# Patient Record
Sex: Female | Born: 1975
Health system: Southern US, Community
[De-identification: ages and names within clinical notes are randomized; demographics above are authoritative.]

## PROBLEM LIST (undated history)

## (undated) DIAGNOSIS — D649 Anemia, unspecified: Secondary | ICD-10-CM

## (undated) DIAGNOSIS — I10 Essential (primary) hypertension: Secondary | ICD-10-CM

## (undated) HISTORY — PX: TONSILLECTOMY: SHX5217

## (undated) HISTORY — DX: Essential (primary) hypertension: I10

## (undated) HISTORY — PX: LEG SURGERY: SHX1003

## (undated) HISTORY — DX: Anemia, unspecified: D64.9

---

## 1998-01-03 ENCOUNTER — Ambulatory Visit (HOSPITAL_COMMUNITY): Admission: RE | Admit: 1998-01-03 | Discharge: 1998-01-03 | Payer: Self-pay | Admitting: Obstetrics and Gynecology

## 1998-01-29 ENCOUNTER — Ambulatory Visit (HOSPITAL_COMMUNITY): Admission: RE | Admit: 1998-01-29 | Discharge: 1998-01-29 | Payer: Self-pay | Admitting: Obstetrics and Gynecology

## 1998-02-08 ENCOUNTER — Ambulatory Visit (HOSPITAL_COMMUNITY): Admission: RE | Admit: 1998-02-08 | Discharge: 1998-02-08 | Payer: Self-pay | Admitting: Obstetrics and Gynecology

## 1998-04-27 ENCOUNTER — Inpatient Hospital Stay (HOSPITAL_COMMUNITY): Admission: AD | Admit: 1998-04-27 | Discharge: 1998-04-29 | Payer: Self-pay | Admitting: Obstetrics and Gynecology

## 2001-04-25 ENCOUNTER — Encounter (INDEPENDENT_AMBULATORY_CARE_PROVIDER_SITE_OTHER): Payer: Self-pay | Admitting: Specialist

## 2001-04-26 ENCOUNTER — Encounter: Payer: Self-pay | Admitting: Emergency Medicine

## 2001-04-26 ENCOUNTER — Inpatient Hospital Stay (HOSPITAL_COMMUNITY): Admission: EM | Admit: 2001-04-26 | Discharge: 2001-04-28 | Payer: Self-pay | Admitting: Family Medicine

## 2001-08-17 HISTORY — PX: APPENDECTOMY: SHX54

## 2004-08-17 HISTORY — PX: TOE SURGERY: SHX1073

## 2005-04-24 ENCOUNTER — Other Ambulatory Visit: Admission: RE | Admit: 2005-04-24 | Discharge: 2005-04-24 | Payer: Self-pay | Admitting: Internal Medicine

## 2006-11-08 ENCOUNTER — Other Ambulatory Visit: Admission: RE | Admit: 2006-11-08 | Discharge: 2006-11-08 | Payer: Self-pay | Admitting: *Deleted

## 2007-11-15 ENCOUNTER — Other Ambulatory Visit: Admission: RE | Admit: 2007-11-15 | Discharge: 2007-11-15 | Payer: Self-pay | Admitting: Family Medicine

## 2011-01-02 NOTE — Op Note (Signed)
[facility]  Patient:    [name], [name] Visit Number: [id number] MRN: [id number]          Service Type: SUR Location: 1E 0106 01 Attending Physician:  Donnetta Hutching Proc. Date: 04/26/01 Admit Date:  04/25/2001                             Operative Report  PREOPERATIVE DIAGNOSIS:   Acute appendicitis.  POSTOPERATIVE DIAGNOSIS:  Acute appendicitis.  OPERATION:  Open appendectomy.  SURGEON:  Abigail Miyamoto, M.D.  ANESTHESIA:  General endotracheal anesthesia and 0.5% Marcaine plain.  ESTIMATED BLOOD LOSS:  Minimal.  INDICATIONS:  The patient is a 35 year old female who presented to the emergency department with vague abdominal pain and nausea which has been localized to the right upper quadrant.  She was found to have a normal white blood count, however, was found to have a left shift. A CAT scan of the abdomen was performed which showed findings consistent with possible early acute appendicitis.  FINDINGS:  The patient was found to have acute appendicitis.  DESCRIPTION OF PROCEDURE:  The patient was brought to the operating room and identified as Julia Morris.  She was placed supine on the operating table, and general anesthesia was induced. Her abdomen was then prepped and draped in the usual sterile fashion.  Using a #10 blade a small transverse incision was made in the patients right lower quadrant.  Incision was carried down through Scarpas fascia with electrocautery.  The external oblique fascia was identified and opened with the cautery.  The underlying muscles were then split.  The underlying muscles were then split down to the perineum which was then opened with a scalpel.  The cecum was then identified and elevated up out of the wound.  The patient did have some mildly turbid fluid in the abdominal cavity.  The appendix was identified and found to be quite acutely inflamed. It was tethered down underneath the cecum, therefore the  base of the appendix was first clamped with hemostats and then cut with the scalpel. The base was then tied off with two separate 2-0 Vicryl ties.  The appendiceal stump was then cauterized.  The proximal was likewise tied off.  The mesentery was then taken down with clamps and Vicryl tied as well.  Next, the fascia had to be opened further to allow the cecum and small bowel to be placed back into the abdominal cavity.  The abdomen was then copiously irrigated with normal saline. A separate small vein bleeding on the side of the cecum was tied off with two separate 2-0 silk sutures.  The abdomen was then copiously irrigated with normal saline.  The posterior layer was then closed with a running 2-0 Vicryl suture.  The anterior fascia was then closed with a running 2-0 Vicryl suture as well.  The wound was again then irrigated and anesthestized with 0.25% Marcaine.  The Scarpas fascia was then closed with interrupted 3-0 Vicryl suture.  The skin was closed with running 4-0 Vicryl suture. Steri-Strips were then applied.  The patient tolerated the procedure well. All sponge, needle and instrument counts were correct at the end of the procedure.  The patient was then extubated in the operating room and taken in stable condition to the recovery room. Attending Physician:  Donnetta Hutching DD:  04/26/01 TD:  04/26/01 Job: [id number]

## 2011-03-09 ENCOUNTER — Encounter: Payer: Self-pay | Admitting: Family Medicine

## 2011-03-09 ENCOUNTER — Ambulatory Visit (INDEPENDENT_AMBULATORY_CARE_PROVIDER_SITE_OTHER): Payer: Self-pay | Admitting: Family Medicine

## 2011-03-09 VITALS — BP 159/88 | HR 69 | Temp 98.4°F | Ht 64.0 in | Wt 185.0 lb

## 2011-03-09 DIAGNOSIS — M25572 Pain in left ankle and joints of left foot: Secondary | ICD-10-CM

## 2011-03-09 DIAGNOSIS — M25579 Pain in unspecified ankle and joints of unspecified foot: Secondary | ICD-10-CM

## 2011-03-09 NOTE — Patient Instructions (Signed)
This area of your pain is not a typical location for a stress fracture. It is, however, a common location for extensor tendinitis of your foot. Regardless, I would rest from running for the next 1 week. Ok to cross train (biking, swimming preferable) in the meantime. After this time start a walk:jog program 1 minute: 1 minute for 10 minutes, increase jog time by 1 minute each session and total running time by 5 minutes per session (as long as your pain does not come back). Consider inserts with arch support and cushion for your shoes - these blunt the compressive and rotational forces that lead to stress fractures. Ice for 15 minutes after your workouts. Aleve 1-2 tabs twice a day with food is ok to take if you have mild pain. When you get back into running, ok to do so as long as you're not limping and pain stays less than a 3 on a scale of 1-10.  If pain starts to worsen again, come see me. Follow up with me as needed.

## 2011-03-10 ENCOUNTER — Encounter: Payer: Self-pay | Admitting: Family Medicine

## 2011-03-10 DIAGNOSIS — M25572 Pain in left ankle and joints of left foot: Secondary | ICD-10-CM | POA: Insufficient documentation

## 2011-03-10 NOTE — Progress Notes (Signed)
Subjective:    Patient ID: Julia Morris, female    DOB: 04-Sep-1975, 35 y.o.   MRN: [id number]  PCP: [facility]  HPI 35 yo F here for left proximal foot/shin pain.  Patient reports she is currently training to do a marathon. Runs about 6-7x/week 1 1/2 to 3 miles at a time (6 miles long run) - total of about 20 miles a week. About 2 weeks ago she developed pain in proximal left foot during her run that worsened with running. Iced the area but has not taken any medicines. Tried running again and noticed pain in this area but also radiating into distal shin, lateral ankle. No swelling or bruising. No injury. No prior history of stress fracture. No pain currently - has not run for past week. Pain tends to come on about 1 mile into her runs (back before she stopped running).  History reviewed. No pertinent past medical history.  No current outpatient prescriptions on file prior to visit.    Past Surgical History  Procedure Date  . Leg surgery     femoral rod placement s/p fracture 2005 left leg    No Known Allergies  History   Social History  . Marital Status: Single    Spouse Name: N/A    Number of Children: N/A  . Years of Education: N/A   Occupational History  . Not on file.   Social History Main Topics  . Smoking status: Never Smoker   . Smokeless tobacco: Not on file  . Alcohol Use: Not on file  . Drug Use: Not on file  . Sexually Active: Not on file   Other Topics Concern  . Not on file   Social History Narrative  . No narrative on file    Family History  Problem Relation Age of Onset  . Hypertension Mother   . Hyperlipidemia Mother   . Hypertension Father   . Hyperlipidemia Father   . Diabetes Maternal Aunt   . Hypertension Maternal Aunt   . Hyperlipidemia Maternal Aunt   . Diabetes Maternal Uncle   . Hypertension Maternal Uncle   . Hyperlipidemia Maternal Uncle   . Diabetes Paternal Aunt   . Hypertension Paternal Aunt   .  Hyperlipidemia Paternal Aunt   . Diabetes Paternal Uncle   . Hypertension Paternal Uncle   . Hyperlipidemia Paternal Uncle   . Diabetes Maternal Grandmother   . Hypertension Maternal Grandmother   . Hyperlipidemia Maternal Grandmother   . Diabetes Maternal Grandfather   . Hypertension Maternal Grandfather   . Hyperlipidemia Maternal Grandfather   . Diabetes Paternal Grandmother   . Hypertension Paternal Grandmother   . Hyperlipidemia Paternal Grandmother   . Heart attack Neg Hx   . Sudden death Neg Hx     BP 159/88  Pulse 69  Temp(Src) 98.4 F (36.9 C) (Oral)  Ht 5\' 4"  (1.626 m)  Wt 185 lb (83.915 kg)  BMI 31.76 kg/m2  Review of Systems See HPI above.    Objective:   Physical Exam Gen: NAD L foot/ankle: No gross deformity, swelling, bruising. No focal TTP.  Area of her pain from before is within anterior ankle extensor tendons proximally into lower leg lateral to tibia.  No TTP tibia, fibular head, malleoli, base 5th, all metatarsals, navicular. FROM ankle without pain.  5/5 strength all directions without pain. Negative ant drawer and talar tilt. Negative thompsons Negative hop test and tuning for testing.      Assessment & Plan:  1.  Left foot/ankle pain - patients exam currently normal without pain.  Location of her pain and radiation up shin lateral to tibia suggests most likely extensor tendinopathy (extensor digitorum and tibialis anterior).  Stress fracture tests also normal and this is not a typical area to get a stress fracture.  Rest for 1 week, cross train in meantime then start walk:jog program.  Icing, tylenol/aleve as needed.  Consider arch support/inserts.  See instructions for further.

## 2011-03-10 NOTE — Assessment & Plan Note (Signed)
patients exam currently normal without pain.  Location of her pain and radiation up shin lateral to tibia suggests most likely extensor tendinopathy (extensor digitorum and tibialis anterior).  Stress fracture tests also normal and this is not a typical area to get a stress fracture.  Rest for 1 week, cross train in meantime then start walk:jog program.  Icing, tylenol/aleve as needed.  Consider arch support/inserts.  See instructions for further.

## 2012-09-27 ENCOUNTER — Ambulatory Visit (INDEPENDENT_AMBULATORY_CARE_PROVIDER_SITE_OTHER): Payer: BC Managed Care – PPO | Admitting: Family

## 2012-09-27 ENCOUNTER — Encounter: Payer: Self-pay | Admitting: Family

## 2012-09-27 VITALS — BP 150/104 | HR 91 | Temp 98.4°F | Resp 16 | Ht 64.0 in | Wt 208.1 lb

## 2012-09-27 DIAGNOSIS — R51 Headache: Secondary | ICD-10-CM

## 2012-09-27 DIAGNOSIS — I10 Essential (primary) hypertension: Secondary | ICD-10-CM

## 2012-09-27 DIAGNOSIS — H6120 Impacted cerumen, unspecified ear: Secondary | ICD-10-CM

## 2012-09-27 DIAGNOSIS — H6690 Otitis media, unspecified, unspecified ear: Secondary | ICD-10-CM

## 2012-09-27 DIAGNOSIS — R002 Palpitations: Secondary | ICD-10-CM

## 2012-09-27 LAB — BASIC METABOLIC PANEL WITH GFR
BUN: 17 mg/dL (ref 6–23)
CO2: 29 mEq/L (ref 19–32)
Calcium: 9.4 mg/dL (ref 8.4–10.5)
Chloride: 103 mEq/L (ref 96–112)
Creat: 0.87 mg/dL (ref 0.50–1.10)
GFR, Est African American: >89 mL/min
GFR, Est Non African American: 87 mL/min
Glucose, Bld: 80 mg/dL (ref 70–99)
Potassium: 4.1 mEq/L (ref 3.5–5.3)
Sodium: 139 mEq/L (ref 135–145)

## 2012-09-27 LAB — CBC WITH DIFFERENTIAL/PLATELET
Basophils Relative: 1 % (ref 0–1)
Eosinophils Absolute: 0 10*3/uL (ref 0.0–0.7)
Eosinophils Relative: 0 % (ref 0–5)
MCH: 27.8 pg (ref 26.0–34.0)
MCHC: 33.3 g/dL (ref 30.0–36.0)
MCV: 83.7 fL (ref 78.0–100.0)
Neutrophils Relative %: 44 % (ref 43–77)
Platelets: 291 10*3/uL (ref 150–400)
RDW: 14.6 % (ref 11.5–15.5)

## 2012-09-27 MED ORDER — AMOXICILLIN-POT CLAVULANATE 875-125 MG PO TABS: 1.0000 | ORAL_TABLET | Freq: Two times a day (BID) | ORAL | Status: DC

## 2012-09-27 MED ORDER — HYDROCHLOROTHIAZIDE 25 MG PO TABS: 25.0000 mg | ORAL_TABLET | Freq: Every day | ORAL | Status: DC

## 2012-09-27 NOTE — Patient Instructions (Addendum)
Please follow up in 2 weeks.   Complete your lab work prior to leaving.

## 2012-09-27 NOTE — Progress Notes (Signed)
Subjective:    Patient ID: Julia Morris, female    DOB: 06-Dec-1975, 37 y.o.   MRN: [id number]  HPI  Julia Morris is a 37 yr old female here today to establish care.  1) HA-  Uses frequent "goody powder" She reports headaches 3-4 times a week for 1 year.  She sometimes will use Advil PM's for migraines.  Headache is generally across the front/sides of her head.  3) HTN- she reports that she has been on medication in the past.  She has not taken in 5-6 years.  She sometimes checks at home- it was 158/105.    4) Palpitations- reports that she has palpitations often, sometimes for a few seconds, generally lasts only a minute.  Drinks 2-3 caffeinated soft drinks a day.    She reports that last week she went to urgent care due to dizziness last week and was rx'd hydrocodone, lorazepam, meclizine amd prednisone.  She reports ear fullness for 3-4 months.  She notes trouble hearing out of the right ear.  She denies pain.    Review of Systems  Constitutional:       Reports 20 pound weight gain in 6 weeks.  HENT: Positive for hearing loss.   Eyes: Negative for visual disturbance.  Respiratory: Negative for shortness of breath.   Cardiovascular: Positive for palpitations.  Genitourinary: Negative for menstrual problem.  Musculoskeletal: Negative for myalgias and arthralgias.  Skin: Negative for rash.  Neurological: Negative for headaches.  Hematological: Negative for adenopathy.  Psychiatric/Behavioral:       Denies anxiety or depression   Past Medical History  Diagnosis Date  . Hypertension   . Anemia     ? history of anemia    History   Social History  . Marital Status: Single    Spouse Name: N/A    Number of Children: 1  . Years of Education: N/A   Occupational History  .  [employer] [place]   Social History Main Topics  . Smoking status: Never Smoker   . Smokeless tobacco: Never Used  . Alcohol Use: Yes     Comment: occasional  . Drug Use: Not on file  . Sexually  Active: Not on file   Other Topics Concern  . 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   Enjoys running/exercise.    Past Surgical History  Procedure Laterality Date  . Leg surgery      femoral rod placement s/p fracture 2005 left leg  . Tonsillectomy  1980s  . Appendectomy  2003  . Toe surgery  2006    right great toe.     Family History  Problem Relation Age of Onset  . Hypertension Mother   . Hyperlipidemia Mother   . Hypertension Father   . Hyperlipidemia Father   . Diabetes Maternal Aunt   . Hypertension Maternal Aunt   . Hyperlipidemia Maternal Aunt   . Diabetes Maternal Uncle   . Hypertension Maternal Uncle   . Hyperlipidemia Maternal Uncle   . Diabetes Paternal Aunt   . Hypertension Paternal Aunt   . Hyperlipidemia Paternal Aunt   . Diabetes Paternal Uncle   . Hypertension Paternal Uncle   . Hyperlipidemia Paternal Uncle   . Diabetes Maternal  Grandmother   . Hypertension Maternal Grandmother   . Hyperlipidemia Maternal Grandmother   . Diabetes Maternal Grandfather   . Hypertension Maternal Grandfather   . Hyperlipidemia Maternal Grandfather   . Diabetes Paternal Grandmother   . Hypertension Paternal Grandmother   . Hyperlipidemia Paternal Grandmother   . Heart attack Neg Hx   . Sudden death Neg Hx     No Known Allergies  No current outpatient prescriptions on file prior to visit.   No current facility-administered medications on file prior to visit.    BP 150/104  Pulse 91  Temp(Src) 98.4 F (36.9 C) (Oral)  Resp 16  Ht 5\' 4"  (1.626 m)  Wt 208 lb 0.8 oz (94.371 kg)  BMI 35.69 kg/m2  SpO2 96%  LMP 09/18/2011         Objective:   Physical Exam  Constitutional: She is oriented to person, place, and time. She appears well-developed and  well-nourished. No distress.  HENT:  Head: Normocephalic and atraumatic.  Bilateral tms occluded by cerumen. Wax plugs removed. r tm +erythema l tm normal.  Eyes: Conjunctivae are normal.  Cardiovascular: Normal rate and regular rhythm.   No murmur heard. Pulmonary/Chest: Effort normal and breath sounds normal. No respiratory distress. She has no wheezes.  Musculoskeletal: She exhibits no edema.  Lymphadenopathy:    She has no cervical adenopathy.  Neurological: She is alert and oriented to person, place, and time.  Psychiatric: She has a normal mood and affect. Her behavior is normal.          Assessment & Plan:

## 2012-09-28 ENCOUNTER — Encounter: Payer: Self-pay | Admitting: Family

## 2012-09-28 LAB — TSH: TSH: 2.651 u[IU]/mL (ref 0.350–4.500)

## 2012-09-29 DIAGNOSIS — H6120 Impacted cerumen, unspecified ear: Secondary | ICD-10-CM | POA: Insufficient documentation

## 2012-09-29 DIAGNOSIS — R002 Palpitations: Secondary | ICD-10-CM | POA: Insufficient documentation

## 2012-09-29 DIAGNOSIS — H6690 Otitis media, unspecified, unspecified ear: Secondary | ICD-10-CM | POA: Insufficient documentation

## 2012-09-29 DIAGNOSIS — R519 Headache, unspecified: Secondary | ICD-10-CM | POA: Insufficient documentation

## 2012-09-29 DIAGNOSIS — R51 Headache: Secondary | ICD-10-CM | POA: Insufficient documentation

## 2012-09-29 NOTE — Assessment & Plan Note (Signed)
Could be sinus component. Will see if augmentin helps. Stop nsaids, aspirin products. Instead, recommended prn tylenol.

## 2012-09-29 NOTE — Assessment & Plan Note (Signed)
ekg shows nsr. Obtain bmet, cbc, tsh. If normal and symptoms persist, plan holter monitor.

## 2012-09-29 NOTE — Assessment & Plan Note (Signed)
rx with augmentin.stop prednisone, benzo, vicodin. Likely cause for vertigo.

## 2012-09-29 NOTE — Assessment & Plan Note (Signed)
Cerumen removed with curette. Pt tolerated well.   

## 2012-10-11 ENCOUNTER — Ambulatory Visit: Payer: BC Managed Care – PPO | Admitting: Family

## 2012-10-18 ENCOUNTER — Ambulatory Visit (INDEPENDENT_AMBULATORY_CARE_PROVIDER_SITE_OTHER): Payer: BC Managed Care – PPO | Admitting: Family

## 2012-10-18 ENCOUNTER — Encounter: Payer: Self-pay | Admitting: Family

## 2012-10-18 VITALS — BP 128/88 | HR 88 | Temp 99.0°F | Resp 16 | Ht 64.0 in | Wt 209.1 lb

## 2012-10-18 DIAGNOSIS — R51 Headache: Secondary | ICD-10-CM

## 2012-10-18 DIAGNOSIS — R002 Palpitations: Secondary | ICD-10-CM

## 2012-10-18 DIAGNOSIS — I10 Essential (primary) hypertension: Secondary | ICD-10-CM

## 2012-10-18 LAB — BASIC METABOLIC PANEL
CO2: 28 mEq/L (ref 19–32)
Calcium: 8.8 mg/dL (ref 8.4–10.5)
Creat: 0.83 mg/dL (ref 0.50–1.10)
Glucose, Bld: 79 mg/dL (ref 70–99)
Sodium: 139 mEq/L (ref 135–145)

## 2012-10-18 NOTE — Progress Notes (Signed)
Subjective:    Patient ID: Julia Morris, female    DOB: May 30, 1976, 37 y.o.   MRN: [id number]  HPI  Ms.  Morris is a 37 yr old female who presents today for follow up.  1) HTN- she is on hctz. She reports that her BP at home has been running about 128/70 at home.   Denies associated side effects.    2) Dizziness- last visit she was treated with augmentin and prn meclizine.  3) Palpitations- EKG, BMET and TSH were unremarkable last visit.     4) HA- Reports that she stopped the goodie powder.  HA's are improved.     Review of Systems See HPI  Past Medical History  Diagnosis Date  . Hypertension   . Anemia     ? history of anemia    History   Social History  . Marital Status: Single    Spouse Name: N/A    Number of Children: 1  . Years of Education: N/A   Occupational History  .  [employer] [place]   Social History Main Topics  . Smoking status: Never Smoker   . Smokeless tobacco: Never Used  . Alcohol Use: Yes     Comment: occasional  . Drug Use: Not on file  . Sexually Active: Not on file   Other Topics Concern  . 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   Enjoys running/exercise.    Past Surgical History  Procedure Laterality Date  . Leg surgery      femoral rod placement s/p fracture 2005 left leg  . Tonsillectomy  1980s  . Appendectomy  2003  . Toe surgery  2006    right great toe.     Family History  Problem Relation Age of Onset  . Hypertension Mother   . Hyperlipidemia Mother   . Hypertension Father   . Hyperlipidemia Father   . Diabetes Maternal Aunt   . Hypertension Maternal Aunt   . Hyperlipidemia Maternal Aunt   . Diabetes Maternal Uncle   . Hypertension Maternal Uncle   . Hyperlipidemia Maternal Uncle   . Diabetes Paternal Aunt    . Hypertension Paternal Aunt   . Hyperlipidemia Paternal Aunt   . Diabetes Paternal Uncle   . Hypertension Paternal Uncle   . Hyperlipidemia Paternal Uncle   . Diabetes Maternal Grandmother   . Hypertension Maternal Grandmother   . Hyperlipidemia Maternal Grandmother   . Diabetes Maternal Grandfather   . Hypertension Maternal Grandfather   . Hyperlipidemia Maternal Grandfather   . Diabetes Paternal Grandmother   . Hypertension Paternal Grandmother   . Hyperlipidemia Paternal Grandmother   . Heart attack Neg Hx   . Sudden death Neg Hx     No Known Allergies  Current Outpatient Prescriptions on File Prior to Visit  Medication Sig Dispense Refill  . hydrochlorothiazide (HYDRODIURIL) 25 MG tablet Take 1 tablet (25 mg total) by mouth daily.  30 tablet  2  . meclizine (ANTIVERT) 25 MG tablet Take 25 mg by mouth 3 (three) times daily as needed.       No current facility-administered medications on file prior to visit.    BP 140/90  Pulse 88  Temp(Src) 99 F (37.2 C) (Oral)  Resp 16  Ht 5\' 4"  (1.626 m)  Wt 209 lb 1.3 oz (94.838 kg)  BMI 35.87 kg/m2  SpO2 98%  LMP 09/18/2011       Objective:   Physical Exam  Constitutional: She is oriented to person, place, and time. She appears well-developed and well-nourished. No distress.  HENT:  Head: Normocephalic and atraumatic.  Cardiovascular: Normal rate and regular rhythm.   No murmur heard. Pulmonary/Chest: Effort normal and breath sounds normal. No respiratory distress. She has no wheezes. She has no rales. She exhibits no tenderness.  Musculoskeletal: She exhibits no edema.  Neurological: She is alert and oriented to person, place, and time.  Skin: Skin is warm and dry.  Psychiatric: She has a normal mood and affect. Her behavior is normal. Judgment and thought content normal.          Assessment & Plan:

## 2012-10-18 NOTE — Patient Instructions (Addendum)
Please complete your lab work prior to leaving.  Follow up in 3 months for a fasting complete physical.

## 2012-10-18 NOTE — Assessment & Plan Note (Signed)
Resolved. Monitor.  

## 2012-10-18 NOTE — Assessment & Plan Note (Signed)
Improved. Monitor.  

## 2012-10-18 NOTE — Assessment & Plan Note (Signed)
BP Readings from Last 3 Encounters:  10/18/12 128/88  09/27/12 150/104  03/09/11 159/88    BP is improved. Continue hctz, obtain bmet.

## 2012-12-30 ENCOUNTER — Encounter: Payer: Self-pay | Admitting: Family

## 2013-01-02 ENCOUNTER — Encounter: Payer: Self-pay | Admitting: Family

## 2013-01-16 ENCOUNTER — Ambulatory Visit (INDEPENDENT_AMBULATORY_CARE_PROVIDER_SITE_OTHER): Payer: BC Managed Care – PPO | Admitting: Family

## 2013-01-16 ENCOUNTER — Encounter: Payer: Self-pay | Admitting: Family

## 2013-01-16 VITALS — BP 110/88 | HR 80 | Temp 98.6°F | Resp 16 | Ht 64.0 in | Wt 205.1 lb

## 2013-01-16 DIAGNOSIS — Z0000 Encounter for general adult medical examination without abnormal findings: Secondary | ICD-10-CM

## 2013-01-16 DIAGNOSIS — I10 Essential (primary) hypertension: Secondary | ICD-10-CM

## 2013-01-16 LAB — BASIC METABOLIC PANEL WITH GFR
CO2: 28 mEq/L (ref 19–32)
Calcium: 9.8 mg/dL (ref 8.4–10.5)
Chloride: 101 mEq/L (ref 96–112)
Creat: 0.79 mg/dL (ref 0.50–1.10)
GFR, Est Non African American: >89 mL/min
Sodium: 138 mEq/L (ref 135–145)

## 2013-01-16 LAB — LIPID PANEL
HDL: 48 mg/dL (ref >39)
LDL Cholesterol: 155 mg/dL — ABNORMAL HIGH (ref 0–99)
Total CHOL/HDL Ratio: 4.6 Ratio
Triglycerides: 98 mg/dL (ref <150)
VLDL: 20 mg/dL (ref 0–40)

## 2013-01-16 LAB — CBC WITH DIFFERENTIAL/PLATELET
Eosinophils Relative: 1 % (ref 0–5)
HCT: 39.1 % (ref 36.0–46.0)
Hemoglobin: 13.3 g/dL (ref 12.0–15.0)
Lymphocytes Relative: 41 % (ref 12–46)
Lymphs Abs: 1.9 10*3/uL (ref 0.7–4.0)
MCV: 82.1 fL (ref 78.0–100.0)
Monocytes Absolute: 0.5 10*3/uL (ref 0.1–1.0)
Monocytes Relative: 10 % (ref 3–12)
Neutro Abs: 2.3 10*3/uL (ref 1.7–7.7)
WBC: 4.8 10*3/uL (ref 4.0–10.5)

## 2013-01-16 LAB — HEPATIC FUNCTION PANEL
AST: 22 U/L (ref 0–37)
Albumin: 4.1 g/dL (ref 3.5–5.2)
Bilirubin, Direct: 0.1 mg/dL (ref 0.0–0.3)
Total Bilirubin: 0.4 mg/dL (ref 0.3–1.2)

## 2013-01-16 LAB — TSH: TSH: 2.443 u[IU]/mL (ref 0.350–4.500)

## 2013-01-16 MED ORDER — HYDROCHLOROTHIAZIDE 25 MG PO TABS: 25.0000 mg | ORAL_TABLET | Freq: Every day | ORAL | Status: DC

## 2013-01-16 NOTE — Progress Notes (Signed)
Subjective:    Patient ID: Julia Morris, female    DOB: 1976/03/08, 37 y.o.   MRN: [id number]  HPI  Patient presents today for complete physical.  Immunizations: tetanus is up to date.  Diet: Trying to improve her diet. She has gained 50 pounds in the last 18 months.  Exercise: runs 3 days a week, 6-8 miles. Pap Smear: 3/13- went 3 years without period- has mirena, wants std testing  Review of Systems  Constitutional: Positive for unexpected weight change.  HENT: Negative for hearing loss and congestion.   Eyes: Negative for visual disturbance.  Respiratory: Negative for cough and shortness of breath.   Cardiovascular:       Occasional swelling after running  Gastrointestinal: Negative for nausea, vomiting and diarrhea.  Genitourinary: Negative for dysuria, frequency and menstrual problem.  Musculoskeletal: Negative for myalgias and arthralgias.       Notes some leg cramping  Skin: Negative for rash.  Neurological: Negative for headaches.  Hematological: Negative for adenopathy.  Psychiatric/Behavioral:       Denies depression or anxiety   Past Medical History  Diagnosis Date  . Hypertension   . Anemia     ? history of anemia    History   Social History  . Marital Status: Single    Spouse Name: N/A    Number of Children: 1  . Years of Education: N/A   Occupational History  .  [employer] [place]   Social History Main Topics  . Smoking status: Never Smoker   . Smokeless tobacco: Never Used  . Alcohol Use: Yes     Comment: occasional  . Drug Use: Not on file  . Sexually Active: Not on file   Other Topics Concern  . 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   Enjoys running/exercise.    Past Surgical History  Procedure Laterality Date  . Leg surgery     femoral rod placement s/p fracture 2005 left leg  . Tonsillectomy  1980s  . Appendectomy  2003  . Toe surgery  2006    right great toe.     Family History  Problem Relation Age of Onset  . Hypertension Mother   . Hyperlipidemia Mother   . Hypertension Father   . Hyperlipidemia Father   . Diabetes Maternal Aunt   . Hypertension Maternal Aunt   . Hyperlipidemia Maternal Aunt   . Diabetes Maternal Uncle   . Hypertension Maternal Uncle   . Hyperlipidemia Maternal Uncle   . Diabetes Paternal Aunt   . Hypertension Paternal Aunt   . Hyperlipidemia Paternal Aunt   . Diabetes Paternal Uncle   . Hypertension Paternal Uncle   . Hyperlipidemia Paternal Uncle   . Diabetes Maternal Grandmother   . Hypertension Maternal Grandmother   . Hyperlipidemia Maternal Grandmother   . Diabetes Maternal Grandfather   . Hypertension Maternal Grandfather   . Hyperlipidemia Maternal Grandfather   . Diabetes Paternal Grandmother   . Hypertension Paternal Grandmother   . Hyperlipidemia Paternal Grandmother   . Heart attack Neg Hx   . Sudden death Neg Hx     Allergies  Allergen Reactions  . Prednisone Swelling    No current outpatient prescriptions on file prior to visit.  No current facility-administered medications on file prior to visit.    BP 110/88  Pulse 80  Temp(Src) 98.6 F (37 C) (Oral)  Resp 16  Ht 5\' 4"  (1.626 m)  Wt 205 lb 1.9 oz (93.042 kg)  BMI 35.19 kg/m2  SpO2 98%  LMP 01/16/2013       Objective:   Physical Exam  Physical Exam  Constitutional: She is oriented to person, place, and time. She appears well-developed and well-nourished. No distress.  HENT:  Head: Normocephalic and atraumatic.  Right Ear: Tympanic membrane and ear canal normal.  Left Ear: Tympanic membrane and ear canal normal.  Mouth/Throat: Oropharynx is clear and moist.  Eyes: Pupils are equal, round, and reactive to light. No scleral icterus.  Neck: Normal range of motion. No thyromegaly  present.  Cardiovascular: Normal rate and regular rhythm.   No murmur heard. Pulmonary/Chest: Effort normal and breath sounds normal. No respiratory distress. He has no wheezes. She has no rales. She exhibits no tenderness.  Abdominal: Soft. Bowel sounds are normal. He exhibits no distension and no mass. There is no tenderness. There is no rebound and no guarding.  Musculoskeletal: She exhibits no edema.  Lymphadenopathy:    She has no cervical adenopathy.  Neurological: She is alert and oriented to person, place, and time.  She exhibits normal muscle tone. Coordination normal.  Skin: Skin is warm and dry.  Psychiatric: She has a normal mood and affect. Her behavior is normal. Judgment and thought content normal.  Breasts: Examined lying Right: Without masses, retractions, discharge or axillary adenopathy.  Left: Without masses, retractions, discharge or axillary adenopathy.  GYN- external vulva normal          Assessment & Plan:         Assessment & Plan:

## 2013-01-16 NOTE — Patient Instructions (Addendum)
Please complete lab work prior to leaving. Please schedule a follow up appointment in 6 months.   

## 2013-01-16 NOTE — Assessment & Plan Note (Signed)
Will defer pap until next year as she denies hx of abnormal pap and reports that she has had regular pap smears.  Obtain fasting lab work. Discussed diet, exercise, weight loss sbe.  Immunizations up to date.  Performed wet prep, GC/Chlamydia at pt request for screening purposes.

## 2013-01-16 NOTE — Addendum Note (Signed)
Addended by: Mervin Kung A on: 01/16/2013 10:25 AM   Modules accepted: Orders

## 2013-01-17 ENCOUNTER — Telehealth: Payer: Self-pay | Admitting: Family

## 2013-01-17 LAB — URINALYSIS, ROUTINE W REFLEX MICROSCOPIC
Bilirubin Urine: NEGATIVE
Ketones, ur: NEGATIVE mg/dL
Protein, ur: NEGATIVE mg/dL
Urobilinogen, UA: 0.2 mg/dL (ref 0.0–1.0)

## 2013-01-17 LAB — WET PREP BY MOLECULAR PROBE: Gardnerella vaginalis: POSITIVE — AB

## 2013-01-17 LAB — GC/CHLAMYDIA PROBE AMP
CT Probe RNA: NEGATIVE
GC Probe RNA: NEGATIVE

## 2013-01-17 MED ORDER — METRONIDAZOLE 500 MG PO TABS: 500.0000 mg | ORAL_TABLET | Freq: Two times a day (BID) | ORAL | Status: DC

## 2013-01-17 NOTE — Telephone Encounter (Signed)
Reviewed lab work.  Notified pt: Notes + trichomonas, + BV. Plan rx with metronidazole x 7 days. (no ETOH while on this medication).  She is no longer with previous sexual partner, but I advised her to let him know so he can also be treated.  Reviewed other labs, including mild elevation of cholesterol- pt advised on low fat/low cholesterol diet and exercise.

## 2013-01-18 ENCOUNTER — Encounter: Payer: Self-pay | Admitting: Family

## 2013-01-18 NOTE — Telephone Encounter (Signed)
Rx did not transmit to pharmacy on 01/17/13. Called verbal to pharmacist.

## 2013-01-20 ENCOUNTER — Encounter: Payer: Self-pay | Admitting: Family

## 2013-02-09 ENCOUNTER — Encounter: Payer: Self-pay | Admitting: Family

## 2013-02-13 ENCOUNTER — Ambulatory Visit (INDEPENDENT_AMBULATORY_CARE_PROVIDER_SITE_OTHER): Payer: BC Managed Care – PPO | Admitting: Family

## 2013-02-13 ENCOUNTER — Encounter: Payer: Self-pay | Admitting: Family

## 2013-02-13 VITALS — BP 130/84 | HR 78 | Temp 98.0°F | Resp 18 | Wt 210.1 lb

## 2013-02-13 DIAGNOSIS — B353 Tinea pedis: Secondary | ICD-10-CM

## 2013-02-13 MED ORDER — FLUCONAZOLE 150 MG PO TABS: ORAL_TABLET | ORAL | Status: DC

## 2013-02-13 NOTE — Assessment & Plan Note (Signed)
Trial of once weekly diflucan in addition to OTC topical agents.  Plan 4 week rx.  Pt is instructed to call if symptoms worsen or if not improved in 1 month. She verbalizes understanding.

## 2013-02-13 NOTE — Patient Instructions (Addendum)
Athlete's Foot Athlete's foot (tinea pedis) is a fungal infection of the skin on the feet. It often occurs on the skin between the toes or underneath the toes. It can also occur on the soles of the feet. Athlete's foot is more likely to occur in hot, humid weather. Not washing your feet or changing your socks often enough can contribute to athlete's foot. The infection can spread from person to person (contagious). CAUSES Athlete's foot is caused by a fungus. This fungus thrives in warm, moist places. Most people get athlete's foot by sharing shower stalls, towels, and wet floors with an infected person. People with weakened immune systems, including those with diabetes, may be more likely to get athlete's foot. SYMPTOMS   Itchy areas between the toes or on the soles of the feet.  White, flaky, or scaly areas between the toes or on the soles of the feet.  Tiny, intensely itchy blisters between the toes or on the soles of the feet.  Tiny cuts on the skin. These cuts can develop a bacterial infection.  Thick or discolored toenails. DIAGNOSIS  Your caregiver can usually tell what the problem is by doing a physical exam. Your caregiver may also take a skin sample from the rash area. The skin sample may be examined under a microscope, or it may be tested to see if fungus will grow in the sample. A sample may also be taken from your toenail for testing. TREATMENT  Over-the-counter and prescription medicines can be used to kill the fungus. These medicines are available as powders or creams. Your caregiver can suggest medicines for you. Fungal infections respond slowly to treatment. You may need to continue using your medicine for several weeks. PREVENTION   Do not share towels.  Wear sandals in wet areas, such as shared locker rooms and shared showers.  Keep your feet dry. Wear shoes that allow air to circulate. Wear cotton or wool socks. HOME CARE INSTRUCTIONS   Take medicines as directed by  your caregiver. Do not use steroid creams on athlete's foot.  Keep your feet clean and cool. Wash your feet daily and dry them thoroughly, especially between your toes.  Change your socks every day. Wear cotton or wool socks. In hot climates, you may need to change your socks 2 to 3 times per day.  Wear sandals or canvas tennis shoes with good air circulation.  If you have blisters, soak your feet in Burow's solution or Epsom salts for 20 to 30 minutes, 2 times a day to dry out the blisters. Make sure you dry your feet thoroughly afterward. SEEK MEDICAL CARE IF:   You have a fever.  You have swelling, soreness, warmth, or redness in your foot.  You are not getting better after 7 days of treatment.  You are not completely cured after 30 days.  You have any problems caused by your medicines. MAKE SURE YOU:   Understand these instructions.  Will watch your condition.  Will get help right away if you are not doing well or get worse. Document Released: 07/31/2000 Document Revised: 10/26/2011 Document Reviewed: 05/22/2011 Exit[employer] Patient Information 2014 [employer].  

## 2013-02-13 NOTE — Progress Notes (Signed)
Subjective:    Patient ID: Julia Morris, female    DOB: 06-02-1976, 37 y.o.   MRN: [id number]  HPI  [name] is a 37 yr old female who presents today with chief complaint of peeling, itching skin on the bottoms of her feet.  Has tried 3-4 different types of OTC athletes foot preps without improvement.  Has been ongoing on for 1 year.    Review of Systems See HPI  Past Medical History  Diagnosis Date  . Hypertension   . Anemia     ? history of anemia    History   Social History  . Marital Status: Single    Spouse Name: N/A    Number of Children: 1  . Years of Education: N/A   Occupational History  .  [employer] [place]   Social History Main Topics  . Smoking status: Never Smoker   . Smokeless tobacco: Never Used  . Alcohol Use: Yes     Comment: occasional  . Drug Use: Not on file  . Sexually Active: Not on file   Other Topics Concern  . 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   Enjoys running/exercise.    Past Surgical History  Procedure Laterality Date  . Leg surgery      femoral rod placement s/p fracture 2005 left leg  . Tonsillectomy  1980s  . Appendectomy  2003  . Toe surgery  2006    right great toe.     Family History  Problem Relation Age of Onset  . Hypertension Mother   . Hyperlipidemia Mother   . Hypertension Father   . Hyperlipidemia Father   . Diabetes Maternal Aunt   . Hypertension Maternal Aunt   . Hyperlipidemia Maternal Aunt   . Diabetes Maternal Uncle   . Hypertension Maternal Uncle   . Hyperlipidemia Maternal Uncle   . Diabetes Paternal Aunt   . Hypertension Paternal Aunt   . Hyperlipidemia Paternal Aunt   . Diabetes Paternal Uncle   . Hypertension Paternal Uncle   . Hyperlipidemia Paternal Uncle   . Diabetes Maternal  Grandmother   . Hypertension Maternal Grandmother   . Hyperlipidemia Maternal Grandmother   . Diabetes Maternal Grandfather   . Hypertension Maternal Grandfather   . Hyperlipidemia Maternal Grandfather   . Diabetes Paternal Grandmother   . Hypertension Paternal Grandmother   . Hyperlipidemia Paternal Grandmother   . Heart attack Neg Hx   . Sudden death Neg Hx     Allergies  Allergen Reactions  . Prednisone Swelling    Current Outpatient Prescriptions on File Prior to Visit  Medication Sig Dispense Refill  . hydrochlorothiazide (HYDRODIURIL) 25 MG tablet Take 1 tablet (25 mg total) by mouth daily.  30 tablet  5  . levonorgestrel (MIRENA) 20 MCG/24HR IUD 1 each by Intrauterine route once.       No current facility-administered medications on file prior to visit.    BP 130/84  Pulse 78  Temp(Src) 98 F (36.7 C) (Oral)  Resp 18  Wt 210 lb 1.3 oz (95.292 kg)  BMI 36.04 kg/m2  LMP 01/16/2013       Objective:   Physical Exam  Constitutional: She is oriented to person, place, and time.  She appears well-developed and well-nourished. No distress.  Cardiovascular: Normal rate.   Neurological: She is alert and oriented to person, place, and time.  Skin:  Peeling noted bilateral feet.    Psychiatric: She has a normal mood and affect. Her behavior is normal. Judgment and thought content normal.          Assessment & Plan:

## 2013-04-19 ENCOUNTER — Encounter: Payer: Self-pay | Admitting: Family

## 2013-06-22 ENCOUNTER — Other Ambulatory Visit: Payer: Self-pay

## 2013-09-22 ENCOUNTER — Encounter: Payer: Self-pay | Admitting: Family

## 2014-02-06 ENCOUNTER — Encounter: Payer: BC Managed Care – PPO | Admitting: Family

## 2014-02-19 ENCOUNTER — Ambulatory Visit (INDEPENDENT_AMBULATORY_CARE_PROVIDER_SITE_OTHER): Payer: BC Managed Care – PPO | Admitting: Family

## 2014-02-19 ENCOUNTER — Other Ambulatory Visit (HOSPITAL_COMMUNITY)
Admission: RE | Admit: 2014-02-19 | Discharge: 2014-02-19 | Disposition: A | Discharge status: Home / Self Care | Payer: BC Managed Care – PPO | Source: Ambulatory Visit | Attending: Family | Admitting: Family

## 2014-02-19 ENCOUNTER — Encounter: Payer: Self-pay | Admitting: Family

## 2014-02-19 ENCOUNTER — Other Ambulatory Visit: Payer: Self-pay | Admitting: Family

## 2014-02-19 VITALS — BP 140/84 | HR 101 | Temp 98.5°F | Resp 16 | Ht 64.75 in | Wt 221.1 lb

## 2014-02-19 DIAGNOSIS — Z113 Encounter for screening for infections with a predominantly sexual mode of transmission: Secondary | ICD-10-CM | POA: Insufficient documentation

## 2014-02-19 DIAGNOSIS — N760 Acute vaginitis: Secondary | ICD-10-CM | POA: Insufficient documentation

## 2014-02-19 DIAGNOSIS — Z01419 Encounter for gynecological examination (general) (routine) without abnormal findings: Secondary | ICD-10-CM

## 2014-02-19 DIAGNOSIS — Z1151 Encounter for screening for human papillomavirus (HPV): Secondary | ICD-10-CM | POA: Insufficient documentation

## 2014-02-19 DIAGNOSIS — Z0000 Encounter for general adult medical examination without abnormal findings: Secondary | ICD-10-CM

## 2014-02-19 LAB — CBC WITH DIFFERENTIAL/PLATELET
BASOS PCT: 0 % (ref 0–1)
Basophils Absolute: 0 10*3/uL (ref 0.0–0.1)
EOS ABS: 0.1 10*3/uL (ref 0.0–0.7)
EOS PCT: 2 % (ref 0–5)
HEMATOCRIT: 37.7 % (ref 36.0–46.0)
Hemoglobin: 12.8 g/dL (ref 12.0–15.0)
LYMPHS PCT: 37 % (ref 12–46)
Lymphs Abs: 2.1 10*3/uL (ref 0.7–4.0)
MCH: 27.9 pg (ref 26.0–34.0)
MCHC: 34 g/dL (ref 30.0–36.0)
MCV: 82.1 fL (ref 78.0–100.0)
MONO ABS: 0.5 10*3/uL (ref 0.1–1.0)
Monocytes Relative: 9 % (ref 3–12)
Neutro Abs: 3 10*3/uL (ref 1.7–7.7)
Neutrophils Relative %: 52 % (ref 43–77)
Platelets: 299 10*3/uL (ref 150–400)
RBC: 4.59 MIL/uL (ref 3.87–5.11)
RDW: 14.6 % (ref 11.5–15.5)
WBC: 5.7 10*3/uL (ref 4.0–10.5)

## 2014-02-19 MED ORDER — HYDROCHLOROTHIAZIDE 25 MG PO TABS: 25.0000 mg | ORAL_TABLET | Freq: Every day | ORAL | Status: DC

## 2014-02-19 NOTE — Progress Notes (Signed)
Pre visit review using our clinic review tool, if applicable. No additional management support is needed unless otherwise documented below in the visit note. 

## 2014-02-19 NOTE — Progress Notes (Signed)
Subjective:    Patient ID: Julia Morris, female    DOB: Aug 16, 1976, 38 y.o.   MRN: [id number]  HPI  Julia Morris is a 38 yr old female who presents today for cpx.  Patient presents today for complete physical. Julia Morris is also requesting std testing.   Immunizations: tetanus up to date Diet: has tried to eat healthy Wt Readings from Last 3 Encounters:  02/19/14 221 lb 1.3 oz (100.281 kg)  02/13/13 210 lb 1.3 oz (95.292 kg)  01/16/13 205 lb 1.9 oz (93.042 kg)  Exercise: walks daily Pap Smear: 2010- normal per pt Dental: up to date Mammogram: will begin at 69  Review of Systems  Constitutional: Positive for unexpected weight change.  HENT: Negative for hearing loss and postnasal drip.   Eyes: Negative for visual disturbance.  Respiratory: Negative for cough and shortness of breath.   Cardiovascular: Negative for chest pain.  Gastrointestinal: Negative for nausea, vomiting and diarrhea.  Genitourinary: Negative for dysuria, frequency and hematuria.  Musculoskeletal: Negative for arthralgias and myalgias.  Skin: Negative for rash.  Neurological:       Occasional headaches- not as often as before  Hematological: Negative for adenopathy.  Psychiatric/Behavioral:       Denies depression/anxiety   Past Medical History  Diagnosis Date  . Hypertension   . Anemia     ? history of anemia    History   Social History  . Marital Status: Single    Spouse Name: N/A    Number of Children: 1  . Years of Education: N/A   Occupational History  .  [employer] [place]   Social History Main Topics  . Smoking status: Never Smoker   . Smokeless tobacco: Never Used  . Alcohol Use: Yes     Comment: occasional  . Drug Use: Not on file  . Sexual Activity: Not on file   Other Topics Concern  . Not on file   Social History Narrative   Works as a Manufacturing engineer   Working on [place]- in [place]   1 child- daughter born in 1999   Julia Morris lives with Julia Morris parents and  daughter, in process of getting ready to move out.   Enjoys running/exercise.    Past Surgical History  Procedure Laterality Date  . Leg surgery      femoral rod placement s/p fracture 2005 left leg  . Tonsillectomy  1980s  . Appendectomy  2003  . Toe surgery  2006    right great toe.     Family History  Problem Relation Age of Onset  . Hypertension Mother   . Hyperlipidemia Mother   . Stroke Mother   . Hypertension Father   . Hyperlipidemia Father   . Diabetes Maternal Aunt   . Hypertension Maternal Aunt   . Hyperlipidemia Maternal Aunt   . Diabetes Maternal Uncle   . Hypertension Maternal Uncle   . Hyperlipidemia Maternal Uncle   . Diabetes Paternal Aunt   . Hypertension Paternal Aunt   . Hyperlipidemia Paternal Aunt   . Diabetes Paternal Uncle   . Hypertension Paternal Uncle   . Hyperlipidemia Paternal Uncle   . Diabetes Maternal Grandmother   . Hypertension Maternal Grandmother   . Hyperlipidemia Maternal Grandmother   . Diabetes Maternal Grandfather   . Hypertension Maternal Grandfather   . Hyperlipidemia Maternal Grandfather   . Diabetes Paternal Grandmother   . Hypertension Paternal Grandmother   . Hyperlipidemia Paternal Grandmother   .  Heart attack Neg Hx   . Sudden death Neg Hx     Allergies  Allergen Reactions  . Prednisone Swelling    Current Outpatient Prescriptions on File Prior to Visit  Medication Sig Dispense Refill  . hydrochlorothiazide (HYDRODIURIL) 25 MG tablet Take 1 tablet (25 mg total) by mouth daily.  30 tablet  5  . levonorgestrel (MIRENA) 20 MCG/24HR IUD 1 each by Intrauterine route once.       No current facility-administered medications on file prior to visit.    BP 140/84  Pulse 101  Temp(Src) 98.5 F (36.9 C) (Oral)  Resp 16  Ht 5' 4.75" (1.645 m)  Wt 221 lb 1.3 oz (100.281 kg)  BMI 37.06 kg/m2  SpO2 97%       Objective:   Physical Exam  Physical Exam  Constitutional: Julia Morris is oriented to person, place, and time.  Julia Morris appears well-developed and well-nourished. No distress.  HENT:  Head: Normocephalic and atraumatic.  Right Ear: Tympanic membrane and ear canal normal.  Left Ear: Tympanic membrane and ear canal normal.  Mouth/Throat: Oropharynx is clear and moist.  Eyes: Pupils are equal, round, and reactive to light. No scleral icterus.  Neck: Normal range of motion. No thyromegaly present.  Cardiovascular: Normal rate and regular rhythm.   No murmur heard. Pulmonary/Chest: Effort normal and breath sounds normal. No respiratory distress. He has no wheezes. Julia Morris has no rales. Julia Morris exhibits no tenderness.  Abdominal: Soft. Bowel sounds are normal. He exhibits no distension and no mass. There is no tenderness. There is no rebound and no guarding.  Musculoskeletal: Julia Morris exhibits no edema.  Lymphadenopathy:    Julia Morris has no cervical adenopathy.  Neurological: Julia Morris is alert and oriented to person, place, and time. Julia Morris has normal reflexes. Julia Morris exhibits normal muscle tone. Coordination normal.  Skin: Skin is warm and dry.  Psychiatric: Julia Morris has a normal mood and affect. Julia Morris behavior is normal. Judgment and thought content normal.  Breasts: Examined lying Right: Without masses, retractions, discharge or axillary adenopathy.  Left: Without masses, retractions, discharge or axillary adenopathy.  Inguinal/mons: Normal without inguinal adenopathy  External genitalia: Normal  BUS/Urethra/Skene's glands: Normal  Bladder: Normal  Vagina: Normal  Cervix: Normal, IUD string is noted Uterus: normal in size, shape and contour. Midline and mobile  Adnexa/parametria:  Rt: Without masses or tenderness.  Lt: Without masses or tenderness.  Anus and perineum: Normal           Assessment & Plan:          Assessment & Plan:

## 2014-02-19 NOTE — Patient Instructions (Signed)
Please complete lab work prior to leaving.  Follow up in 6 months.  

## 2014-02-20 ENCOUNTER — Encounter: Payer: Self-pay | Admitting: Family

## 2014-02-20 LAB — HEPATIC FUNCTION PANEL
ALT: 17 U/L (ref 0–35)
AST: 21 U/L (ref 0–37)
Albumin: 3.9 g/dL (ref 3.5–5.2)
Alkaline Phosphatase: 73 U/L (ref 39–117)
Bilirubin, Direct: 0.1 mg/dL (ref 0.0–0.3)
Indirect Bilirubin: 0.3 mg/dL (ref 0.2–1.2)
Total Bilirubin: 0.4 mg/dL (ref 0.2–1.2)
Total Protein: 7.2 g/dL (ref 6.0–8.3)

## 2014-02-20 LAB — BASIC METABOLIC PANEL WITH GFR
BUN: 11 mg/dL (ref 6–23)
CALCIUM: 9.3 mg/dL (ref 8.4–10.5)
CHLORIDE: 105 meq/L (ref 96–112)
CO2: 22 meq/L (ref 19–32)
CREATININE: 0.84 mg/dL (ref 0.50–1.10)
GFR, Est African American: >89 mL/min
GFR, Est Non African American: 89 mL/min
GLUCOSE: 105 mg/dL — AB (ref 70–99)
Potassium: 3.6 mEq/L (ref 3.5–5.3)
Sodium: 140 mEq/L (ref 135–145)

## 2014-02-20 LAB — LIPID PANEL
CHOLESTEROL: 206 mg/dL — AB (ref 0–200)
HDL: 37 mg/dL — AB (ref >39)
LDL CALC: 137 mg/dL — AB (ref 0–99)
TRIGLYCERIDES: 162 mg/dL — AB (ref <150)
Total CHOL/HDL Ratio: 5.6 Ratio
VLDL: 32 mg/dL (ref 0–40)

## 2014-02-20 LAB — URINALYSIS, MICROSCOPIC ONLY
Bacteria, UA: NONE SEEN
CRYSTALS: NONE SEEN
Casts: NONE SEEN

## 2014-02-20 LAB — URINALYSIS, ROUTINE W REFLEX MICROSCOPIC
BILIRUBIN URINE: NEGATIVE
Glucose, UA: NEGATIVE mg/dL
Hgb urine dipstick: NEGATIVE
KETONES UR: NEGATIVE mg/dL
Leukocytes, UA: NEGATIVE
Nitrite: NEGATIVE
PH: 6 (ref 5.0–8.0)
Protein, ur: NEGATIVE mg/dL
SPECIFIC GRAVITY, URINE: 1.029 (ref 1.005–1.030)
Urobilinogen, UA: 1 mg/dL (ref 0.0–1.0)

## 2014-02-20 LAB — CYTOLOGY - PAP

## 2014-02-20 LAB — RPR

## 2014-02-20 LAB — HIV ANTIBODY (ROUTINE TESTING W REFLEX): HIV 1&2 Ab, 4th Generation: NONREACTIVE

## 2014-02-20 LAB — TSH: TSH: 2.611 u[IU]/mL (ref 0.350–4.500)

## 2014-02-21 LAB — HEMOGLOBIN A1C
HEMOGLOBIN A1C: 5.9 % — AB (ref <5.7)
Mean Plasma Glucose: 123 mg/dL — ABNORMAL HIGH (ref <117)

## 2014-02-21 MED ORDER — METRONIDAZOLE 500 MG PO TABS: 500.0000 mg | ORAL_TABLET | Freq: Three times a day (TID) | ORAL | Status: DC

## 2014-02-21 NOTE — Telephone Encounter (Signed)
Could you please ask lab to add on trichomonas to sample from 7/6?

## 2014-02-21 NOTE — Telephone Encounter (Signed)
Melissa, please see cervicovaginal result in EPIC and advise.

## 2014-02-22 NOTE — Assessment & Plan Note (Signed)
Discussed healthy diet, exercise, weight loss. Pap performed today. Immunizations up to date.

## 2014-02-27 NOTE — Telephone Encounter (Signed)
Melissa, trichomonas result is under the pap smear result on the second page. I just sent pt a mychart message with the negative result.

## 2014-03-15 ENCOUNTER — Encounter: Payer: Self-pay | Admitting: Family

## 2014-03-16 MED ORDER — FLUCONAZOLE 150 MG PO TABS: 150.0000 mg | ORAL_TABLET | ORAL | Status: DC

## 2014-04-27 ENCOUNTER — Encounter: Payer: Self-pay | Admitting: Family

## 2014-04-27 DIAGNOSIS — R400 Somnolence: Secondary | ICD-10-CM

## 2014-07-02 ENCOUNTER — Other Ambulatory Visit (HOSPITAL_COMMUNITY)
Admission: RE | Admit: 2014-07-02 | Discharge: 2014-07-02 | Disposition: A | Discharge status: Home / Self Care | Payer: BC Managed Care – PPO | Source: Ambulatory Visit | Attending: Family | Admitting: Family

## 2014-07-02 ENCOUNTER — Ambulatory Visit (HOSPITAL_BASED_OUTPATIENT_CLINIC_OR_DEPARTMENT_OTHER): Payer: BC Managed Care – PPO | Attending: Family | Admitting: Radiology

## 2014-07-02 ENCOUNTER — Encounter: Payer: Self-pay | Admitting: Family

## 2014-07-02 ENCOUNTER — Ambulatory Visit (INDEPENDENT_AMBULATORY_CARE_PROVIDER_SITE_OTHER): Payer: BC Managed Care – PPO | Admitting: Family

## 2014-07-02 ENCOUNTER — Ambulatory Visit: Payer: BC Managed Care – PPO | Admitting: Family

## 2014-07-02 VITALS — BP 128/86 | HR 90 | Temp 98.2°F | Resp 16 | Wt 215.2 lb

## 2014-07-02 VITALS — Ht 64.0 in | Wt 215.0 lb

## 2014-07-02 DIAGNOSIS — G4733 Obstructive sleep apnea (adult) (pediatric): Secondary | ICD-10-CM

## 2014-07-02 DIAGNOSIS — R400 Somnolence: Secondary | ICD-10-CM

## 2014-07-02 DIAGNOSIS — R0683 Snoring: Secondary | ICD-10-CM | POA: Insufficient documentation

## 2014-07-02 DIAGNOSIS — R5383 Other fatigue: Secondary | ICD-10-CM | POA: Diagnosis present

## 2014-07-02 DIAGNOSIS — N760 Acute vaginitis: Secondary | ICD-10-CM

## 2014-07-02 NOTE — Progress Notes (Signed)
Subjective:    Patient ID: Julia Morris, female    DOB: March 04, 1976, 38 y.o.   MRN: [id number]  HPI  Ms. Deluna is a 38 yr old female who presents toay with chief complaint of vaginal discharge.  Reports symptoms started 10 days ago. Denies associated itching. Has not tried any otc meds.  Notes occasional odor. Denies new sexual partners.   Review of Systems See HPI  Past Medical History  Diagnosis Date  . Hypertension   . Anemia     ? history of anemia    History   Social History  . Marital Status: Single    Spouse Name: N/A    Number of Children: 1  . Years of Education: N/A   Occupational History  .  [employer] [place]   Social History Main Topics  . Smoking status: Never Smoker   . Smokeless tobacco: Never Used  . Alcohol Use: Yes     Comment: occasional  . Drug Use: Not on file  . Sexual Activity: Not on file   Other Topics Concern  . Not on file   Social History Narrative   Works as a Manufacturing engineer   Working on [employer]- in [place]   1 child- daughter born in 1999   She lives with her parents and daughter, in process of getting ready to move out.   Enjoys running/exercise.    Past Surgical History  Procedure Laterality Date  . Leg surgery      femoral rod placement s/p fracture 2005 left leg  . Tonsillectomy  1980s  . Appendectomy  2003  . Toe surgery  2006    right great toe.     Family History  Problem Relation Age of Onset  . Hypertension Mother   . Hyperlipidemia Mother   . Stroke Mother   . Hypertension Father   . Hyperlipidemia Father   . Diabetes Maternal Aunt   . Hypertension Maternal Aunt   . Hyperlipidemia Maternal Aunt   . Diabetes Maternal Uncle   . Hypertension Maternal Uncle   . Hyperlipidemia Maternal Uncle   . Diabetes Paternal Aunt   . Hypertension Paternal Aunt   . Hyperlipidemia Paternal Aunt   . Diabetes Paternal Uncle   . Hypertension Paternal Uncle   . Hyperlipidemia Paternal Uncle   .  Diabetes Maternal Grandmother   . Hypertension Maternal Grandmother   . Hyperlipidemia Maternal Grandmother   . Diabetes Maternal Grandfather   . Hypertension Maternal Grandfather   . Hyperlipidemia Maternal Grandfather   . Diabetes Paternal Grandmother   . Hypertension Paternal Grandmother   . Hyperlipidemia Paternal Grandmother   . Heart attack Neg Hx   . Sudden death Neg Hx     Allergies  Allergen Reactions  . Prednisone Swelling    Current Outpatient Prescriptions on File Prior to Visit  Medication Sig Dispense Refill  . hydrochlorothiazide (HYDRODIURIL) 25 MG tablet Take 1 tablet (25 mg total) by mouth daily. 30 tablet 5  . levonorgestrel (MIRENA) 20 MCG/24HR IUD 1 each by Intrauterine route once.     No current facility-administered medications on file prior to visit.    BP 128/86 mmHg  Pulse 90  Temp(Src) 98.2 F (36.8 C) (Oral)  Resp 16  Wt 215 lb 3.2 oz (97.614 kg)  SpO2 98%       Objective:   Physical Exam  Constitutional: She appears well-developed and well-nourished.  Cardiovascular: Normal rate and regular rhythm.  No murmur heard. Pulmonary/Chest: Effort normal and breath sounds normal.  Genitourinary: Vagina normal. No vaginal discharge found.  Musculoskeletal: She exhibits no edema.  Skin: Skin is warm and dry.  Psychiatric: She has a normal mood and affect. Her behavior is normal. Judgment and thought content normal.          Assessment & Plan:

## 2014-07-02 NOTE — Assessment & Plan Note (Signed)
Wet prep performed today. Suspect BV.  Await results and plan rx pending results.

## 2014-07-02 NOTE — Addendum Note (Signed)
Addended by: Kelle Darting A on: 07/02/2014 03:35 PM   Modules accepted: Orders

## 2014-07-02 NOTE — Patient Instructions (Signed)
We will contact you with your results and further recommendations.  °

## 2014-07-02 NOTE — Progress Notes (Signed)
Pre visit review using our clinic review tool, if applicable. No additional management support is needed unless otherwise documented below in the visit note. 

## 2014-07-04 ENCOUNTER — Telehealth: Payer: Self-pay | Admitting: Family

## 2014-07-04 LAB — CERVICOVAGINAL ANCILLARY ONLY
WET PREP (BD AFFIRM): NEGATIVE
WET PREP (BD AFFIRM): POSITIVE — AB
Wet Prep (BD Affirm): NEGATIVE

## 2014-07-04 MED ORDER — METRONIDAZOLE 500 MG PO TABS: 500.0000 mg | ORAL_TABLET | Freq: Two times a day (BID) | ORAL | Status: DC

## 2014-07-04 NOTE — Telephone Encounter (Signed)
Notified pt and she voices understanding. 

## 2014-07-04 NOTE — Telephone Encounter (Signed)
Lab work shows bacterial vaginosis. Recommend metronidazole bid x 1 week, No ETOH while taking.

## 2014-07-05 ENCOUNTER — Ambulatory Visit (INDEPENDENT_AMBULATORY_CARE_PROVIDER_SITE_OTHER): Payer: BC Managed Care – PPO | Admitting: Medical

## 2014-07-05 ENCOUNTER — Telehealth: Payer: Self-pay | Admitting: Family

## 2014-07-05 ENCOUNTER — Encounter: Payer: Self-pay | Admitting: Medical

## 2014-07-05 VITALS — BP 140/90 | HR 102 | Temp 98.7°F | Ht 65.08 in | Wt 215.6 lb

## 2014-07-05 DIAGNOSIS — J0100 Acute maxillary sinusitis, unspecified: Secondary | ICD-10-CM | POA: Insufficient documentation

## 2014-07-05 DIAGNOSIS — G4710 Hypersomnia, unspecified: Secondary | ICD-10-CM

## 2014-07-05 DIAGNOSIS — I10 Essential (primary) hypertension: Secondary | ICD-10-CM

## 2014-07-05 DIAGNOSIS — J0101 Acute recurrent maxillary sinusitis: Secondary | ICD-10-CM

## 2014-07-05 DIAGNOSIS — G4733 Obstructive sleep apnea (adult) (pediatric): Secondary | ICD-10-CM | POA: Insufficient documentation

## 2014-07-05 MED ORDER — BENZONATATE 100 MG PO CAPS: 100.0000 mg | ORAL_CAPSULE | Freq: Three times a day (TID) | ORAL | Status: DC | PRN

## 2014-07-05 MED ORDER — CEFDINIR 300 MG PO CAPS: 300.0000 mg | ORAL_CAPSULE | Freq: Two times a day (BID) | ORAL | Status: DC

## 2014-07-05 NOTE — Assessment & Plan Note (Signed)
Pt  appears to have started with uri with some subsequent  possible sinusitis and rt om. I am prescribing benzonatate for cough and cefdinir antibiotic.  For your nasal congestion use netty pot only since allergy reported to steroids.

## 2014-07-05 NOTE — Assessment & Plan Note (Signed)
High today but did not take any of her med. So advised when leaves here and gets home to take med immediately.

## 2014-07-05 NOTE — Telephone Encounter (Signed)
Please notify pt that her sleep study shows moderate sleep apnea.  I would like to set up a home cpap for her. She should see me back in office 6 weeks after starting cpap.

## 2014-07-05 NOTE — Sleep Study (Signed)
[place]   NAME: Julia Morris  DATE OF BIRTH: 23-Nov-1975  MEDICAL RECORD [id number]  LOCATION: [facility]   PHYSICIAN: Cameshia Cressman V.   DATE OF STUDY: 07/02/14   SLEEP STUDY TYPE: Nocturnal Polysomnogram   REFERRING PHYSICIAN: Rigoberto Noel, MD   INDICATION FOR STUDY:  38 year old woman presents for evaluation of loud snoring, excessive daytime fatigue and unrefreshing sleep. At the time of this study ,they weighed 215 pounds with a height of 5 ft 4 inches and the BMI of 37, neck size of 16 inches. Epworth sleepiness score was 12   This nocturnal polysomnogram was performed with a sleep technologist in attendance. EEG, EOG,EMG and respiratory parameters recorded. Sleep stages, arousals, limb movements and respiratory data was scored according to criteria laid out by the American Academy of sleep medicine.   SLEEP ARCHITECTURE: Lights out was at 20-35 PM and lights on was at 510 AM. Total sleep time was 385 minutes with a sleep period time of 393 minutes and a sleep efficiency of 97.5 %. Sleep latency was 2 minutes with latency to REM sleep of 109 minutes and wake after sleep onset of 8 minutes. . Sleep stages as a percentage of total sleep time was N1 -2 %,N2- 50 % and REM sleep 15 % ( 58 minutes) . The longest period of REM sleep was around 2 AM.   AROUSAL DATA : There were 132  arousals with an arousal index of 21 events per hour. Most of these were spontaneous & 40 were associated with respiratory events  RESPIRATORY DATA: There were 22 obstructive apneas, 10  central apneas, 0 mixed apneas and 73 hypopneas with apnea -hypopnea index of 16 events per hour. There were 14 RERAs with an RDI of 18 events per hour. There was no relation to sleep stage or body position. Supine sleep was noted  MOVEMENT/PARASOMNIA: There were 0 PLMS with a PLM index of 0 events per hour. The PLM arousal index was 0 per hour.  OXYGEN DATA: The lowest  desaturation was 79 % during REM sleep and the desaturation index was 18 per hour.   CARDIAC DATA: The low heart rate was 74 beats per minute. The high heart rate recorded was an artifact. No arrhythmias were noted   DISCUSSION -Loud snoring was noted . She did not meet criteria for CPAP intervention. She was desensitized with a medium fullface mask  IMPRESSION :  1. Moderate obstructive sleep apnea with hypopneas causing sleep fragmentation and severe oxygen desaturation.  2. No evidence of cardiac arrhythmias,periodic limb movements or behavioral disturbance during sleep.  3. Sleep efficiency was good  RECOMMENDATION:  1. Treatment options for this degree of sleep disordered breathing include weight loss, CPAP therapy and/ or oral appliance. Treatment with auto C Pap 5-15 cm can be initiated and pressure adjusted after checking download in 4 weeks. Alternatively a C-peptide titration study can be performed. 2. Patient should be cautioned against driving when sleepy  3. They should be asked to avoid medications with sedative side effects    Rigoberto Noel MD Diplomate, American Board of Sleep Medicine    ELECTRONICALLY SIGNED ON: 07/05/2014  [facility]  PH: [phone] FX: [phone]) [place]

## 2014-07-05 NOTE — Progress Notes (Signed)
   Subjective:    Patient ID: Julia Morris, female    DOB: 1976-03-08, 38 y.o.   MRN: [id number]  HPI   Pt in with coughing, congestion, runny nose and mild sinus pressure(x 3 days) with some mild bodyaches last night. No body aches now. Pt did have flu vaccine about 1 month ago.  LMP- mirena.  Htn- Pt did not take her bp medicine today. Faint ha presently. No chest pain and on neurologic symptoms or deficits. HA is only faint.      Review of Systems  Constitutional: Negative for fever, chills and fatigue.  HENT: Positive for congestion, rhinorrhea and sinus pressure. Negative for ear discharge, ear pain, nosebleeds, postnasal drip, sore throat and trouble swallowing.   Respiratory: Positive for cough. Negative for chest tightness, shortness of breath and wheezing.   Cardiovascular: Negative for chest pain and palpitations.  Gastrointestinal: Negative for nausea, vomiting, abdominal pain, diarrhea and constipation.  Genitourinary: Negative for dysuria and flank pain.  Musculoskeletal: Negative for back pain.       Mild bodyaches last night none now,  Neurological: Positive for headaches. Negative for dizziness, tremors, seizures, syncope, weakness, light-headedness and numbness.       Very faint minimal ha.  Hematological: Negative for adenopathy. Does not bruise/bleed easily.  Psychiatric/Behavioral: Negative for suicidal ideas, behavioral problems and dysphoric mood. The patient is not nervous/anxious.        Objective:   Physical Exam   General  Mental Status - Alert. General Appearance - Well groomed. Not in acute distress.  Skin Rashes- No Rashes.  HEENT Head- Normal. Ear Auditory Canal - Left- Normal. Right - Normal.Tympanic Membrane- Left- Normal. Right- red tm Eye Sclera/Conjunctiva- Left- Normal. Right- Normal. Nose & Sinuses Nasal Mucosa- Left-  Boggy + Congested. Right-  Boggy + Congested. Rt maxillary sinus pressure Mouth & Throat Lips: Upper Lip-  Normal: no dryness, cracking, pallor, cyanosis, or vesicular eruption. Lower Lip-Normal: no dryness, cracking, pallor, cyanosis or vesicular eruption. Buccal Mucosa- Bilateral- No Aphthous ulcers. Oropharynx- No Discharge or Erythema. Tonsils: Characteristics- Bilateral- No Erythema or Congestion. Size/Enlargement- Bilateral- No enlargement. Discharge- bilateral-None.  Neck Neck- Supple. No Masses.   Chest and Lung Exam Auscultation: Breath Sounds:-Normal. CTA  Cardiovascular Auscultation:Rythm- Regular, Rate and Rhythm. Murmurs & Other Heart Sounds:Ausculatation of the heart reveal- No Murmurs.  Lymphatic Head & Neck General Head & Neck Lymphatics: Bilateral: Description- No Localized lymphadenopathy.  Neurolgic- Cranial nerves III-XII grossly intact.        Assessment & Plan:

## 2014-07-05 NOTE — Patient Instructions (Signed)
Your appear to have started with uri with some subsequent  possible sinusitis and rt om. I am prescribing benzonatate for cough and cefdinir antibiotic.  For your nasal congestion use netty pot only since allergy reported to steroids.  Follow up in 7 days or as needed.

## 2014-07-05 NOTE — Progress Notes (Signed)
Pre visit review using our clinic review tool, if applicable. No additional management support is needed unless otherwise documented below in the visit note. 

## 2014-07-06 NOTE — Telephone Encounter (Signed)
Left message for pt to return my call.

## 2014-07-06 NOTE — Telephone Encounter (Signed)
Notified pt and she is agreeable to proceed with CPAP and will call us to arrange f/u once she is set up with CPAP.

## 2014-08-16 ENCOUNTER — Encounter: Payer: Self-pay | Admitting: Family

## 2014-08-20 ENCOUNTER — Ambulatory Visit: Payer: BC Managed Care – PPO | Admitting: Family

## 2014-09-03 NOTE — Telephone Encounter (Signed)
Patient calling in regarding this. Best # [phone]. Would like callback as soon as possible.

## 2014-09-04 NOTE — Telephone Encounter (Signed)
Received call back from [facility] with [employer] stating if they set pt up with CPAP and ins terminates they will have to take the equipment back. She states that authorization is good through 12/2014 if pt regains insurance before that time they can still initiate CPAP.

## 2014-09-04 NOTE — Telephone Encounter (Signed)
Spoke with Norvel Richards at [facility] re: status of CPAP and she states they were getting ready to set pt up this week but pt may be changing jobs and current insurance will terminate on 09/08/14. Per Norvel Richards, pt is undecided if she wants to move forward with CPAP at this time due to out of pocket costs if ins. Is terminated. Spoke with Norvel Richards again and she states nothing further is needed from [place] but she was going to contact pt's insurance to get urgent authorization to get pt in this week for set up.

## 2014-09-04 NOTE — Telephone Encounter (Signed)
Noted. They will set it up though correct?

## 2014-09-04 NOTE — Telephone Encounter (Signed)
No. They are not going to set it up as pt was certain her insurance will be terminating on 09/08/14 so they feel there is no need to set it up as pt does not have another job as of yet and does not want to pay out of pocket for the machine and supplies. They told her to contact them if she gains other employment by 12/2014.

## 2014-11-04 ENCOUNTER — Encounter: Payer: Self-pay | Admitting: Family

## 2015-04-25 ENCOUNTER — Telehealth: Payer: Self-pay | Admitting: Family

## 2015-04-25 NOTE — Telephone Encounter (Signed)
Pt has relocated to [place]. She has been out of BP meds for a while. She had eye exam this morning and they took pt blood pressure. Today is was 150/97. She is asking if she can get a month or 2 of meds while she finds a new provider in that area?  Pharmacy: [facility] in [place], ph# [phone]

## 2015-04-26 MED ORDER — HYDROCHLOROTHIAZIDE 25 MG PO TABS: 25.0000 mg | ORAL_TABLET | Freq: Every day | ORAL | Status: DC

## 2015-04-26 NOTE — Telephone Encounter (Signed)
Rx called to pharmacy voicemail as I could not find pharmacy in database. Notified pt of rx completion.

## 2015-04-26 NOTE — Telephone Encounter (Signed)
OK to send in 30 day supply of hctz, no refills. I sent rx to local pharmacy in error and I left message to cancel rx at the pharmacy.

## 2015-12-05 DIAGNOSIS — L239 Allergic contact dermatitis, unspecified cause: Secondary | ICD-10-CM | POA: Diagnosis not present

## 2015-12-05 DIAGNOSIS — I10 Essential (primary) hypertension: Secondary | ICD-10-CM | POA: Diagnosis not present

## 2015-12-05 DIAGNOSIS — B353 Tinea pedis: Secondary | ICD-10-CM | POA: Diagnosis not present

## 2015-12-05 DIAGNOSIS — R7309 Other abnormal glucose: Secondary | ICD-10-CM | POA: Diagnosis not present

## 2015-12-06 DIAGNOSIS — L239 Allergic contact dermatitis, unspecified cause: Secondary | ICD-10-CM | POA: Diagnosis not present

## 2015-12-06 DIAGNOSIS — E782 Mixed hyperlipidemia: Secondary | ICD-10-CM | POA: Diagnosis not present

## 2015-12-06 DIAGNOSIS — I10 Essential (primary) hypertension: Secondary | ICD-10-CM | POA: Diagnosis not present

## 2015-12-06 DIAGNOSIS — R7309 Other abnormal glucose: Secondary | ICD-10-CM | POA: Diagnosis not present

## 2017-06-09 ENCOUNTER — Encounter: Payer: Self-pay | Admitting: Family

## 2017-06-09 ENCOUNTER — Ambulatory Visit (INDEPENDENT_AMBULATORY_CARE_PROVIDER_SITE_OTHER): Payer: BLUE CROSS/BLUE SHIELD | Admitting: Obstetrics & Gynecology

## 2017-06-09 ENCOUNTER — Other Ambulatory Visit: Payer: Self-pay | Admitting: Family

## 2017-06-09 ENCOUNTER — Encounter: Payer: Self-pay | Admitting: Obstetrics & Gynecology

## 2017-06-09 VITALS — BP 163/94 | HR 94 | Temp 98.1°F | Ht 64.0 in | Wt 225.0 lb

## 2017-06-09 DIAGNOSIS — Z1151 Encounter for screening for human papillomavirus (HPV): Secondary | ICD-10-CM

## 2017-06-09 DIAGNOSIS — Z113 Encounter for screening for infections with a predominantly sexual mode of transmission: Secondary | ICD-10-CM | POA: Diagnosis not present

## 2017-06-09 DIAGNOSIS — B9689 Other specified bacterial agents as the cause of diseases classified elsewhere: Secondary | ICD-10-CM

## 2017-06-09 DIAGNOSIS — Z124 Encounter for screening for malignant neoplasm of cervix: Secondary | ICD-10-CM | POA: Diagnosis not present

## 2017-06-09 DIAGNOSIS — N6320 Unspecified lump in the left breast, unspecified quadrant: Secondary | ICD-10-CM

## 2017-06-09 DIAGNOSIS — N760 Acute vaginitis: Secondary | ICD-10-CM

## 2017-06-09 DIAGNOSIS — Z01419 Encounter for gynecological examination (general) (routine) without abnormal findings: Secondary | ICD-10-CM | POA: Diagnosis not present

## 2017-06-09 NOTE — Patient Instructions (Signed)
Preventive Care 40-64 Years, Female Preventive care refers to lifestyle choices and visits with your health care provider that can promote health and wellness. What does preventive care include?  A yearly physical exam. This is also called an annual well check.  Dental exams once or twice a year.  Routine eye exams. Ask your health care provider how often you should have your eyes checked.  Personal lifestyle choices, including: ? Daily care of your teeth and gums. ? Regular physical activity. ? Eating a healthy diet. ? Avoiding tobacco and drug use. ? Limiting alcohol use. ? Practicing safe sex. ? Taking low-dose aspirin daily starting at age 58. ? Taking vitamin and mineral supplements as recommended by your health care provider. What happens during an annual well check? The services and screenings done by your health care provider during your annual well check will depend on your age, overall health, lifestyle risk factors, and family history of disease. Counseling Your health care provider may ask you questions about your:  Alcohol use.  Tobacco use.  Drug use.  Emotional well-being.  Home and relationship well-being.  Sexual activity.  Eating habits.  Work and work Statistician.  Method of birth control.  Menstrual cycle.  Pregnancy history.  Screening You may have the following tests or measurements:  Height, weight, and BMI.  Blood pressure.  Lipid and cholesterol levels. These may be checked every 5 years, or more frequently if you are over 81 years old.  Skin check.  Lung cancer screening. You may have this screening every year starting at age 78 if you have a 30-pack-year history of smoking and currently smoke or have quit within the past 15 years.  Fecal occult blood test (FOBT) of the stool. You may have this test every year starting at age 65.  Flexible sigmoidoscopy or colonoscopy. You may have a sigmoidoscopy every 5 years or a colonoscopy  every 10 years starting at age 30.  Hepatitis C blood test.  Hepatitis B blood test.  Sexually transmitted disease (STD) testing.  Diabetes screening. This is done by checking your blood sugar (glucose) after you have not eaten for a while (fasting). You may have this done every 1-3 years.  Mammogram. This may be done every 1-2 years. Talk to your health care provider about when you should start having regular mammograms. This may depend on whether you have a family history of breast cancer.  BRCA-related cancer screening. This may be done if you have a family history of breast, ovarian, tubal, or peritoneal cancers.  Pelvic exam and Pap test. This may be done every 3 years starting at age 80. Starting at age 36, this may be done every 5 years if you have a Pap test in combination with an HPV test.  Bone density scan. This is done to screen for osteoporosis. You may have this scan if you are at high risk for osteoporosis.  Discuss your test results, treatment options, and if necessary, the need for more tests with your health care provider. Vaccines Your health care provider may recommend certain vaccines, such as:  Influenza vaccine. This is recommended every year.  Tetanus, diphtheria, and acellular pertussis (Tdap, Td) vaccine. You may need a Td booster every 10 years.  Varicella vaccine. You may need this if you have not been vaccinated.  Zoster vaccine. You may need this after age 5.  Measles, mumps, and rubella (MMR) vaccine. You may need at least one dose of MMR if you were born in  1957 or later. You may also need a second dose.  Pneumococcal 13-valent conjugate (PCV13) vaccine. You may need this if you have certain conditions and were not previously vaccinated.  Pneumococcal polysaccharide (PPSV23) vaccine. You may need one or two doses if you smoke cigarettes or if you have certain conditions.  Meningococcal vaccine. You may need this if you have certain  conditions.  Hepatitis A vaccine. You may need this if you have certain conditions or if you travel or work in places where you may be exposed to hepatitis A.  Hepatitis B vaccine. You may need this if you have certain conditions or if you travel or work in places where you may be exposed to hepatitis B.  Haemophilus influenzae type b (Hib) vaccine. You may need this if you have certain conditions.  Talk to your health care provider about which screenings and vaccines you need and how often you need them. This information is not intended to replace advice given to you by your health care provider. Make sure you discuss any questions you have with your health care provider. Document Released: 08/30/2015 Document Revised: 04/22/2016 Document Reviewed: 06/04/2015 Elsevier Interactive Patient Education  2017 [employer].

## 2017-06-09 NOTE — Progress Notes (Signed)
GYNECOLOGY ANNUAL PREVENTATIVE CARE ENCOUNTER NOTE  Subjective:   Julia Morris is a 41 y.o. G1P1. female here for a routine annual gynecologic exam and to establish gynecologic care in this office. Was a patient at [facility] in [place]. Unremarkable gynecologic history, had Mirena replaced in 2015 and has been amenorrheic.  Current complaints: has 9 mm left breast lesion seen on mammogram earlier this year, needs follow up.  Patient also desires annual STI screen.   Denies abnormal vaginal bleeding, discharge, pelvic pain, problems with intercourse or other gynecologic concerns.    Gynecologic History No LMP recorded (lmp unknown). Patient is not currently having periods (Reason: IUD). Contraception: Mirena IUD placed 09/18/2013 at [facility], Julia Morris Last Pap: 02/19/2014. Results were: normal with negative HPV Last mammogram: 11/2016. Results were: abnormal with left breast mass, likely fibroadenoma.  Obstetric History OB History  Gravida Para Term Preterm AB Living  1 1 1  0 0 0  SAB TAB Ectopic Multiple Live Births  0 0 0 0 0    # Outcome Date GA Lbr Len/2nd Weight Sex Delivery Anes PTL Lv  1 Term               Past Medical History:  Diagnosis Date  . Anemia    ? history of anemia  . Hypertension     Past Surgical History:  Procedure Laterality Date  . APPENDECTOMY  2003  . LEG SURGERY     femoral rod placement s/p fracture 2005 left leg  . TOE SURGERY  2006   right great toe.   . TONSILLECTOMY  1980s    Current Outpatient Prescriptions on File Prior to Visit  Medication Sig Dispense Refill  . hydrochlorothiazide (HYDRODIURIL) 25 MG tablet Take 1 tablet (25 mg total) by mouth daily. 30 tablet 0  . levonorgestrel (MIRENA) 20 MCG/24HR IUD 1 each by Intrauterine route once.    . benzonatate (TESSALON) 100 MG capsule Take 1 capsule (100 mg total) by mouth 3 (three) times daily as needed for cough. (Patient not taking: Reported on 06/09/2017) 21 capsule 0  .  cefdinir (OMNICEF) 300 MG capsule Take 1 capsule (300 mg total) by mouth 2 (two) times daily. (Patient not taking: Reported on 06/09/2017) 20 capsule 0  . metroNIDAZOLE (FLAGYL) 500 MG tablet Take 1 tablet (500 mg total) by mouth 2 (two) times daily. (Patient not taking: Reported on 06/09/2017) 14 tablet 0   No current facility-administered medications on file prior to visit.     Allergies  Allergen Reactions  . Prednisone Swelling    Social History   Social History  . Marital status: Single    Spouse name: N/A  . Number of children: 1  . Years of education: N/A   Occupational History  .  [employer]   Social History Main Topics  . Smoking status: Never Smoker  . Smokeless tobacco: Never Used  . Alcohol use Yes     Comment: occasional  . Drug use: No  . Sexual activity: No   Other Topics Concern  . Not on file   Social History Narrative   Works as a Manufacturing engineer   Working on [place] in [place]   1 child- daughter born in 1999   She lives with her parents and daughter, in process of getting ready to move out.   Enjoys running/exercise.    Family History  Problem Relation Age of Onset  . Hypertension Mother   .  Hyperlipidemia Mother   . Stroke Mother   . Hypertension Father   . Hyperlipidemia Father   . Diabetes Maternal Aunt   . Hypertension Maternal Aunt   . Hyperlipidemia Maternal Aunt   . Diabetes Maternal Uncle   . Hypertension Maternal Uncle   . Hyperlipidemia Maternal Uncle   . Diabetes Paternal Aunt   . Hypertension Paternal Aunt   . Hyperlipidemia Paternal Aunt   . Diabetes Paternal Uncle   . Hypertension Paternal Uncle   . Hyperlipidemia Paternal Uncle   . Diabetes Maternal Grandmother   . Hypertension Maternal Grandmother   . Hyperlipidemia Maternal Grandmother   . Diabetes Maternal Grandfather   . Hypertension Maternal Grandfather   . Hyperlipidemia Maternal Grandfather   . Diabetes Paternal Grandmother   .  Hypertension Paternal Grandmother   . Hyperlipidemia Paternal Grandmother   . Heart attack Neg Hx   . Sudden death Neg Hx     The following portions of the patient's history were reviewed and updated as appropriate: allergies, current medications, past family history, past medical history, past social history, past surgical history and problem list.  Review of Systems Pertinent items noted in HPI and remainder of comprehensive ROS otherwise negative.   Objective:  BP (!) 163/94 (BP Location: Right Arm, Patient Position: Sitting)   Pulse 94   Temp 98.1 F (36.7 C)   Ht 5\' 4"  (1.626 m)   Wt 225 lb (102.1 kg)   LMP  (LMP Unknown)   BMI 38.62 kg/m  CONSTITUTIONAL: Well-developed, well-nourished female in no acute distress.  HENT:  Normocephalic, atraumatic, External right and left ear normal. Oropharynx is clear and moist EYES: Conjunctivae and EOM are normal. Pupils are equal, round, and reactive to light. No scleral icterus.  NECK: Normal range of motion, supple, no masses.  Normal thyroid.  SKIN: Skin is warm and dry. No rash noted. Not diaphoretic. No erythema. No pallor. NEUROLOGIC: Alert and oriented to person, place, and time. Normal reflexes, muscle tone coordination. No cranial nerve deficit noted. PSYCHIATRIC: Normal mood and affect. Normal behavior. Normal judgment and thought content. CARDIOVASCULAR: Normal heart rate noted, regular rhythm RESPIRATORY: Clear to auscultation bilaterally. Effort and breath sounds normal, no problems with respiration noted. BREASTS: Symmetric in size. No masses, skin changes, nipple drainage, or lymphadenopathy. Did not palpate any mass on left breast ABDOMEN: Soft, normal bowel sounds, no distention noted.  No tenderness, rebound or guarding.  PELVIC: Normal appearing external genitalia; normal appearing vaginal mucosa and cervix.  No abnormal discharge noted.  Pap smear obtained.  Normal uterine size, no other palpable masses, no uterine or  adnexal tenderness. MUSCULOSKELETAL: Normal range of motion. No tenderness.  No cyanosis, clubbing, or edema.  2+ distal pulses.   Assessment and Plan:  1. Encounter for gynecological examination with Papanicolaou smear of cervix Pap smear, STI screen done, will follow up results and manage accordingly. Of note, patient has a history of false positive syphilis tests, will manage accordingly. - Cytology - PAP - Cervicovaginal ancillary only - HIV antibody - Hepatitis C antibody - Hepatitis B surface antigen - RPR  2. Left breast mass Left breast mass imaging ordered, will follow up results and manage accordingly. - US BREAST LTD UNI LEFT INC AXILLA; Future - MM DIAG BREAST TOMO BILATERAL; Future  Routine preventative health maintenance measures emphasized. Please refer to After Visit Summary for other counseling recommendations.    Verita Schneiders, MD, [place] Attending Obstetrician & Gynecologist, [facility]  [facility]

## 2017-06-09 NOTE — Progress Notes (Signed)
Presents for AEX, she is having a discharge, denies odor and itching. Last PAP was 3 yrs ago.

## 2017-06-10 LAB — CERVICOVAGINAL ANCILLARY ONLY
Bacterial vaginitis: POSITIVE — AB
Candida vaginitis: NEGATIVE
Chlamydia: NEGATIVE
Neisseria Gonorrhea: NEGATIVE
Trichomonas: NEGATIVE

## 2017-06-10 LAB — HIV ANTIBODY (ROUTINE TESTING W REFLEX): HIV Screen 4th Generation wRfx: NONREACTIVE

## 2017-06-10 LAB — HEPATITIS C ANTIBODY

## 2017-06-10 LAB — HEPATITIS B SURFACE ANTIGEN: HEP B S AG: NEGATIVE

## 2017-06-10 LAB — RPR: RPR: NONREACTIVE

## 2017-06-11 LAB — CYTOLOGY - PAP
DIAGNOSIS: NEGATIVE
HPV (WINDOPATH): NOT DETECTED

## 2017-06-11 MED ORDER — METRONIDAZOLE 500 MG PO TABS: 500.0000 mg | ORAL_TABLET | Freq: Two times a day (BID) | ORAL | 2 refills | Status: DC

## 2017-06-11 NOTE — Addendum Note (Signed)
Addended by: Verita Schneiders A on: 06/11/2017 12:02 PM   Modules accepted: Orders

## 2017-06-17 ENCOUNTER — Ambulatory Visit (INDEPENDENT_AMBULATORY_CARE_PROVIDER_SITE_OTHER): Payer: BLUE CROSS/BLUE SHIELD | Admitting: Family

## 2017-06-17 ENCOUNTER — Encounter: Payer: Self-pay | Admitting: Family

## 2017-06-17 VITALS — BP 124/86 | HR 77 | Temp 98.1°F | Resp 18 | Wt 224.2 lb

## 2017-06-17 DIAGNOSIS — I10 Essential (primary) hypertension: Secondary | ICD-10-CM

## 2017-06-17 MED ORDER — HYDROCHLOROTHIAZIDE 25 MG PO TABS
25.0000 mg | ORAL_TABLET | Freq: Every day | ORAL | 1 refills | Status: DC
Start: 2017-06-17 — End: 2018-01-31

## 2017-06-17 NOTE — Assessment & Plan Note (Signed)
BP is stable back on hctz. Continue same, obtain follow up bmet.

## 2017-06-17 NOTE — Progress Notes (Signed)
Subjective:    Patient ID: Julia Morris, female    DOB: May 18, 1976, 41 y.o.   MRN: [id number]  HPI  Patient is a 41 yr old female who is back to re-establish care after [place] of [place].   Pmhx is significant for HTN.  She recently saw GYN and BP was elevated.  She reports that she had not been consistent with her bp meds prior to her high reading.  She is now back on hctz.  She denies Chest pain, sob or swelling.  BP Readings from Last 3 Encounters:  06/17/17 124/86  06/09/17 (!) 163/94  07/05/14 140/90     Review of Systems  See HPI     Past Medical History:  Diagnosis Date  . Anemia    ? history of anemia  . Hypertension      Social History   Social History  . Marital status: Single    Spouse name: N/A  . Number of children: 1  . Years of education: N/A   Occupational History  .  [employer]   Social History Main Topics  . Smoking status: Never Smoker  . Smokeless tobacco: Never Used  . Alcohol use Yes     Comment: occasional  . Drug use: No  . Sexual activity: No   Other Topics Concern  . Not on file   Social History Narrative   Works as a Manufacturing engineer   Working on [employer]- in [place]   1 child- daughter born in 1999   She lives with her parents and daughter, in process of getting ready to move out.   Enjoys running/exercise.    Past Surgical History:  Procedure Laterality Date  . APPENDECTOMY  2003  . LEG SURGERY     femoral rod placement s/p fracture 2005 left leg  . TOE SURGERY  2006   right great toe.   . TONSILLECTOMY  1980s    Family History  Problem Relation Age of Onset  . Hypertension Mother   . Hyperlipidemia Mother   . Stroke Mother   . Hypertension Father   . Hyperlipidemia Father   . Diabetes Maternal Aunt   . Hypertension Maternal Aunt   . Hyperlipidemia Maternal Aunt   . Diabetes Maternal Uncle   . Hypertension Maternal Uncle   . Hyperlipidemia Maternal Uncle   . Diabetes Paternal  Aunt   . Hypertension Paternal Aunt   . Hyperlipidemia Paternal Aunt   . Diabetes Paternal Uncle   . Hypertension Paternal Uncle   . Hyperlipidemia Paternal Uncle   . Diabetes Maternal Grandmother   . Hypertension Maternal Grandmother   . Hyperlipidemia Maternal Grandmother   . Diabetes Maternal Grandfather   . Hypertension Maternal Grandfather   . Hyperlipidemia Maternal Grandfather   . Diabetes Paternal Grandmother   . Hypertension Paternal Grandmother   . Hyperlipidemia Paternal Grandmother   . Heart attack Neg Hx   . Sudden death Neg Hx     Allergies  Allergen Reactions  . Prednisone Swelling    Current Outpatient Prescriptions on File Prior to Visit  Medication Sig Dispense Refill  . hydrochlorothiazide (HYDRODIURIL) 25 MG tablet Take 1 tablet (25 mg total) by mouth daily. 30 tablet 0  . levonorgestrel (MIRENA) 20 MCG/24HR IUD 1 each by Intrauterine route once.    . metroNIDAZOLE (FLAGYL) 500 MG tablet Take 1 tablet (500 mg total) by mouth 2 (two) times daily. 14 tablet 2   No current  facility-administered medications on file prior to visit.     BP 124/86 (BP Location: Left Arm, Patient Position: Sitting, Cuff Size: Normal)   Pulse 77   Temp 98.1 F (36.7 C) (Oral)   Resp 18   Wt 224 lb 3.2 oz (101.7 kg)   LMP  (LMP Unknown)   SpO2 98%   BMI 38.48 kg/m    Objective:   Physical Exam  Constitutional: She is oriented to person, place, and time. She appears well-developed and well-nourished.  Cardiovascular: Normal rate, regular rhythm and normal heart sounds.   No murmur heard. Pulmonary/Chest: Effort normal and breath sounds normal. No respiratory distress. She has no wheezes.  Musculoskeletal: She exhibits no edema.  Neurological: She is alert and oriented to person, place, and time.  Psychiatric: She has a normal mood and affect. Her behavior is normal. Judgment and thought content normal.          Assessment & Plan:

## 2017-06-17 NOTE — Patient Instructions (Signed)
Please complete lab work prior to leaving. Please schedule a complete physical at your convenience.

## 2017-06-18 ENCOUNTER — Other Ambulatory Visit (INDEPENDENT_AMBULATORY_CARE_PROVIDER_SITE_OTHER): Payer: BLUE CROSS/BLUE SHIELD

## 2017-06-18 DIAGNOSIS — I10 Essential (primary) hypertension: Secondary | ICD-10-CM | POA: Diagnosis not present

## 2017-06-18 LAB — BASIC METABOLIC PANEL
BUN: 16 mg/dL (ref 6–23)
CHLORIDE: 103 meq/L (ref 96–112)
CO2: 31 meq/L (ref 19–32)
Calcium: 9.3 mg/dL (ref 8.4–10.5)
Creatinine, Ser: 0.81 mg/dL (ref 0.40–1.20)
GFR: 100.38 mL/min (ref >60.00)
Glucose, Bld: 102 mg/dL — ABNORMAL HIGH (ref 70–99)
POTASSIUM: 4.1 meq/L (ref 3.5–5.1)
Sodium: 141 mEq/L (ref 135–145)

## 2017-06-28 ENCOUNTER — Other Ambulatory Visit: Payer: Self-pay | Admitting: Obstetrics & Gynecology

## 2017-06-28 ENCOUNTER — Ambulatory Visit
Admission: RE | Admit: 2017-06-28 | Discharge: 2017-06-28 | Disposition: A | Discharge status: Home / Self Care | Payer: BLUE CROSS/BLUE SHIELD | Source: Ambulatory Visit | Attending: Obstetrics & Gynecology | Admitting: Obstetrics & Gynecology

## 2017-06-28 DIAGNOSIS — N6320 Unspecified lump in the left breast, unspecified quadrant: Secondary | ICD-10-CM

## 2017-06-28 DIAGNOSIS — R928 Other abnormal and inconclusive findings on diagnostic imaging of breast: Secondary | ICD-10-CM | POA: Diagnosis not present

## 2017-06-28 DIAGNOSIS — N6489 Other specified disorders of breast: Secondary | ICD-10-CM | POA: Diagnosis not present

## 2017-06-28 LAB — HM MAMMOGRAPHY

## 2017-07-18 ENCOUNTER — Encounter: Payer: Self-pay | Admitting: Obstetrics & Gynecology

## 2017-07-18 DIAGNOSIS — B379 Candidiasis, unspecified: Secondary | ICD-10-CM

## 2017-07-18 DIAGNOSIS — T3695XA Adverse effect of unspecified systemic antibiotic, initial encounter: Principal | ICD-10-CM

## 2017-07-20 MED ORDER — FLUCONAZOLE 150 MG PO TABS: 150.0000 mg | ORAL_TABLET | Freq: Once | ORAL | 3 refills | Status: AC

## 2017-07-26 ENCOUNTER — Encounter: Payer: BLUE CROSS/BLUE SHIELD | Admitting: Family

## 2017-09-01 ENCOUNTER — Encounter: Payer: Self-pay | Admitting: Family

## 2017-09-01 ENCOUNTER — Ambulatory Visit (INDEPENDENT_AMBULATORY_CARE_PROVIDER_SITE_OTHER): Payer: BLUE CROSS/BLUE SHIELD | Admitting: Family

## 2017-09-01 VITALS — BP 142/70 | HR 90 | Temp 98.4°F | Resp 16 | Ht 64.0 in | Wt 225.0 lb

## 2017-09-01 DIAGNOSIS — Z0001 Encounter for general adult medical examination with abnormal findings: Secondary | ICD-10-CM | POA: Diagnosis not present

## 2017-09-01 DIAGNOSIS — I10 Essential (primary) hypertension: Secondary | ICD-10-CM | POA: Diagnosis not present

## 2017-09-01 DIAGNOSIS — G4733 Obstructive sleep apnea (adult) (pediatric): Secondary | ICD-10-CM

## 2017-09-01 DIAGNOSIS — Z0000 Encounter for general adult medical examination without abnormal findings: Secondary | ICD-10-CM

## 2017-09-01 LAB — URINALYSIS, ROUTINE W REFLEX MICROSCOPIC
Bilirubin Urine: NEGATIVE
HGB URINE DIPSTICK: NEGATIVE
Ketones, ur: NEGATIVE
LEUKOCYTES UA: NEGATIVE
NITRITE: NEGATIVE
RBC / HPF: NONE SEEN (ref 0–?)
Specific Gravity, Urine: 1.025 (ref 1.000–1.030)
Total Protein, Urine: NEGATIVE
Urine Glucose: NEGATIVE
Urobilinogen, UA: 0.2 (ref 0.0–1.0)
WBC, UA: NONE SEEN (ref 0–?)
pH: 5.5 (ref 5.0–8.0)

## 2017-09-01 LAB — BASIC METABOLIC PANEL
BUN: 14 mg/dL (ref 6–23)
CALCIUM: 9.6 mg/dL (ref 8.4–10.5)
CO2: 26 meq/L (ref 19–32)
CREATININE: 0.81 mg/dL (ref 0.40–1.20)
Chloride: 104 mEq/L (ref 96–112)
GFR: 100.28 mL/min (ref >60.00)
GLUCOSE: 93 mg/dL (ref 70–99)
Potassium: 3.8 mEq/L (ref 3.5–5.1)
SODIUM: 138 meq/L (ref 135–145)

## 2017-09-01 LAB — CBC WITH DIFFERENTIAL/PLATELET
BASOS ABS: 0 10*3/uL (ref 0.0–0.1)
Basophils Relative: 0.8 % (ref 0.0–3.0)
Eosinophils Absolute: 0 10*3/uL (ref 0.0–0.7)
Eosinophils Relative: 0.9 % (ref 0.0–5.0)
HCT: 42.3 % (ref 36.0–46.0)
Hemoglobin: 13.8 g/dL (ref 12.0–15.0)
LYMPHS ABS: 2.3 10*3/uL (ref 0.7–4.0)
Lymphocytes Relative: 42.9 % (ref 12.0–46.0)
MCHC: 32.5 g/dL (ref 30.0–36.0)
MCV: 87 fl (ref 78.0–100.0)
MONO ABS: 0.5 10*3/uL (ref 0.1–1.0)
MONOS PCT: 8.8 % (ref 3.0–12.0)
NEUTROS ABS: 2.5 10*3/uL (ref 1.4–7.7)
NEUTROS PCT: 46.6 % (ref 43.0–77.0)
Platelets: 299 10*3/uL (ref 150.0–400.0)
RBC: 4.86 Mil/uL (ref 3.87–5.11)
RDW: 14 % (ref 11.5–15.5)
WBC: 5.3 10*3/uL (ref 4.0–10.5)

## 2017-09-01 LAB — LIPID PANEL
Cholesterol: 201 mg/dL — ABNORMAL HIGH (ref 0–200)
HDL: 44.4 mg/dL (ref >39.00)
LDL CALC: 144 mg/dL — AB (ref 0–99)
NonHDL: 156.85
Total CHOL/HDL Ratio: 5
Triglycerides: 62 mg/dL (ref 0.0–149.0)
VLDL: 12.4 mg/dL (ref 0.0–40.0)

## 2017-09-01 LAB — TSH: TSH: 2.13 u[IU]/mL (ref 0.35–4.50)

## 2017-09-01 LAB — HEPATIC FUNCTION PANEL
ALBUMIN: 4.4 g/dL (ref 3.5–5.2)
ALK PHOS: 66 U/L (ref 39–117)
ALT: 22 U/L (ref 0–35)
AST: 28 U/L (ref 0–37)
BILIRUBIN TOTAL: 0.6 mg/dL (ref 0.2–1.2)
Bilirubin, Direct: 0.1 mg/dL (ref 0.0–0.3)
Total Protein: 8.3 g/dL (ref 6.0–8.3)

## 2017-09-01 NOTE — Progress Notes (Signed)
Subjective:    Patient ID: Julia Morris, female    DOB: [birth year], 42 y.o.   MRN: [id number]  HPI   Patient presents today for complete physical.  Immunizations:  tdap 09/18/11, declines flu shot Diet: reports that she started a boot camp 1 month ago Wt Readings from Last 3 Encounters:  09/01/17 225 lb (102.1 kg)  06/17/17 224 lb 3.2 oz (101.7 kg)  06/09/17 225 lb (102.1 kg)   She has been meal prepping and cutting down on sweets/sodas.  Has already lost 7 pounds Exercise: boot camp 6 days a week, walking during her lunch hour Pap Smear: 06/09/17 Mammogram: had a few months back, will repeat in May-this is being managed by GYN Vision: up to date (10 days ago) Dental: due  OSA- had moderate OSA per sleep study 11/15. She is not using cpap. Reports that she lost her insurance shortly after her sleep study in 2015 and never started cpap.   She is using "this special pacifier which helps with snoring." Reports that she does have some daytime somnolence and sleepiness.    Review of Systems  Constitutional: Negative for unexpected weight change.  HENT: Negative for hearing loss and rhinorrhea.   Eyes: Negative for visual disturbance.  Respiratory: Negative for cough.   Cardiovascular: Negative for leg swelling.  Gastrointestinal: Negative for blood in stool, constipation and diarrhea.  Genitourinary: Negative for dysuria, frequency and hematuria.  Musculoskeletal: Negative for arthralgias and myalgias.  Skin: Negative for rash.  Neurological: Negative for headaches.  Hematological: Negative for adenopathy.  Psychiatric/Behavioral:       Denies depression/anxiety   Past Medical History:  Diagnosis Date  . Anemia    ? history of anemia  . Hypertension      Social History   Socioeconomic History  . Marital status: Single    Spouse name: Not on file  . Number of children: 1  . Years of education: Not on file  . Highest education level: Not on file  Social Needs  .  Financial resource strain: Not on file  . Food insecurity - worry: Not on file  . Food insecurity - inability: Not on file  . Transportation needs - medical: Not on file  . Transportation needs - non-medical: Not on file  Occupational History    Employer: [employer]  Tobacco Use  . Smoking status: Never Smoker  . Smokeless tobacco: Never Used  Substance and Sexual Activity  . Alcohol use: Yes    Comment: occasional  . Drug use: No  . Sexual activity: No    Partners: Male  Other Topics Concern  . Not on file  Social History Narrative   Works as a Manufacturing engineer   Working on [employer]- in [employer], working in [place]   1 child- daughter born in 1999   She lives with her parents and daughter, in process of getting ready to move out.   Enjoys running/exercise.    Past Surgical History:  Procedure Laterality Date  . APPENDECTOMY  2003  . LEG SURGERY     femoral rod placement s/p fracture 2005 left leg  . TOE SURGERY  2006   right great toe.   . TONSILLECTOMY  1980s    Family History  Problem Relation Age of Onset  . Hypertension Mother   . Hyperlipidemia Mother   . Stroke Mother   . Hypertension Father   . Hyperlipidemia Father   . Diabetes Maternal Aunt   .  Hypertension Maternal Aunt   . Hyperlipidemia Maternal Aunt   . Diabetes Maternal Uncle   . Hypertension Maternal Uncle   . Hyperlipidemia Maternal Uncle   . Diabetes Paternal Aunt   . Hypertension Paternal Aunt   . Hyperlipidemia Paternal Aunt   . Diabetes Paternal Uncle   . Hypertension Paternal Uncle   . Hyperlipidemia Paternal Uncle   . Diabetes Maternal Grandmother   . Hypertension Maternal Grandmother   . Hyperlipidemia Maternal Grandmother   . Diabetes Maternal Grandfather   . Hypertension Maternal Grandfather   . Hyperlipidemia Maternal Grandfather   . Diabetes Paternal Grandmother   . Hypertension Paternal Grandmother   . Hyperlipidemia Paternal Grandmother   . Heart attack  Neg Hx   . Sudden death Neg Hx     Allergies  Allergen Reactions  . Prednisone Swelling    Current Outpatient Medications on File Prior to Visit  Medication Sig Dispense Refill  . hydrochlorothiazide (HYDRODIURIL) 25 MG tablet Take 1 tablet (25 mg total) by mouth daily. 90 tablet 1  . levonorgestrel (MIRENA) 20 MCG/24HR IUD 1 each by Intrauterine route once.     No current facility-administered medications on file prior to visit.     BP (!) 142/70 (BP Location: Left Arm, Cuff Size: Large)   Pulse 90   Temp 98.4 F (36.9 C) (Oral)   Resp 16   Ht 5\' 4"  (1.626 m)   Wt 225 lb (102.1 kg)   SpO2 (!) 54%   BMI 38.62 kg/m       Objective:   Physical Exam  Physical Exam  Constitutional: She is oriented to person, place, and time. She appears well-developed and well-nourished. No distress.  HENT:  Head: Normocephalic and atraumatic.  Right Ear: Tympanic membrane and ear canal normal.  Left Ear: Tympanic membrane and ear canal normal.  Mouth/Throat: Oropharynx is clear and moist.  Eyes: Pupils are equal, round, and reactive to light. No scleral icterus.  Neck: Normal range of motion. No thyromegaly present.  Cardiovascular: Normal rate and regular rhythm.   No murmur heard. Pulmonary/Chest: Effort normal and breath sounds normal. No respiratory distress. He has no wheezes. She has no rales. She exhibits no tenderness.  Abdominal: Soft. Bowel sounds are normal. She exhibits no distension and no mass. There is no tenderness. There is no rebound and no guarding.  Musculoskeletal: She exhibits no edema.  Lymphadenopathy:    She has no cervical adenopathy.  Neurological: She is alert and oriented to person, place, and time. She has normal patellar reflexes. She exhibits normal muscle tone. Coordination normal.  Skin: Skin is warm and dry.  Psychiatric: She has a normal mood and affect. Her behavior is normal. Judgment and thought content normal.  Breasts: Examined lying Right:  Without masses, retractions, discharge or axillary adenopathy.  Left: Without masses, retractions, discharge or axillary adenopathy.  Pelvic: deferred to GYN     Assessment & Plan:        Assessment & Plan:   Preventative care- discussed healthy diet, exercise, weight loss. Tetanus is up to date, declines flu shot. mammo up to date- being managed by GYN.  EKG was performed and personally reviewed.  She has chronic T wave inversions in lead III which are unchanged compared to prior.  Normal sinus rhythm is noted.  HTN- sbp mildly elevated- has not take AM hctz yet today, reinforced compliance. Plan to repeat bp in 3 months.    OSA- will repeat home sleep study.  If still  positive plan to initiate CPAP

## 2017-09-01 NOTE — Patient Instructions (Signed)
Please complete lab work prior to leaving. Continue to work on [employer], exercise and weight loss.  Great work so far! You should be contacted about your home sleep study.

## 2017-09-14 ENCOUNTER — Encounter: Payer: Self-pay | Admitting: Obstetrics & Gynecology

## 2017-09-16 ENCOUNTER — Ambulatory Visit (INDEPENDENT_AMBULATORY_CARE_PROVIDER_SITE_OTHER): Payer: BLUE CROSS/BLUE SHIELD

## 2017-09-16 VITALS — BP 142/86 | HR 106

## 2017-09-16 DIAGNOSIS — N898 Other specified noninflammatory disorders of vagina: Secondary | ICD-10-CM | POA: Diagnosis not present

## 2017-09-16 DIAGNOSIS — Z113 Encounter for screening for infections with a predominantly sexual mode of transmission: Secondary | ICD-10-CM | POA: Diagnosis not present

## 2017-09-16 NOTE — Progress Notes (Signed)
Pt here to do a self swab. Pt complains of having vaginal discharge for about a week. She denies having any odor, or itching. She states that sometimes it's thick and sometimes milky. Pt agrees to do STD screening.

## 2017-09-17 LAB — CERVICOVAGINAL ANCILLARY ONLY
Bacterial vaginitis: POSITIVE — AB
CHLAMYDIA, DNA PROBE: NEGATIVE
Candida vaginitis: NEGATIVE
Neisseria Gonorrhea: NEGATIVE
Trichomonas: NEGATIVE

## 2017-09-20 ENCOUNTER — Other Ambulatory Visit: Payer: Self-pay | Admitting: Obstetrics and Gynecology

## 2017-09-20 ENCOUNTER — Encounter: Payer: Self-pay | Admitting: *Deleted

## 2017-09-20 MED ORDER — METRONIDAZOLE 500 MG PO TABS: 500.0000 mg | ORAL_TABLET | Freq: Two times a day (BID) | ORAL | 0 refills | Status: DC

## 2017-09-20 NOTE — Progress Notes (Signed)
Script for BV sent to pharmacy

## 2017-09-25 DIAGNOSIS — G4733 Obstructive sleep apnea (adult) (pediatric): Secondary | ICD-10-CM | POA: Diagnosis not present

## 2017-09-28 DIAGNOSIS — G4733 Obstructive sleep apnea (adult) (pediatric): Secondary | ICD-10-CM | POA: Diagnosis not present

## 2017-09-30 ENCOUNTER — Other Ambulatory Visit: Payer: Self-pay | Admitting: *Deleted

## 2017-09-30 DIAGNOSIS — G4733 Obstructive sleep apnea (adult) (pediatric): Secondary | ICD-10-CM

## 2017-09-30 NOTE — Progress Notes (Signed)
Home sleep test ordered so that new order can be released Parke Poisson, [facility] 09/30/17

## 2017-10-06 ENCOUNTER — Telehealth: Payer: Self-pay | Admitting: Family

## 2017-10-06 ENCOUNTER — Encounter: Payer: Self-pay | Admitting: Family

## 2017-10-06 DIAGNOSIS — G4733 Obstructive sleep apnea (adult) (pediatric): Secondary | ICD-10-CM

## 2017-10-06 NOTE — Telephone Encounter (Signed)
Order entered and faxed to [facility] along with sleep study report, demographics and insurance info faxed to [place] at [phone].

## 2017-10-06 NOTE — Telephone Encounter (Signed)
Please advise pt that sleep study again shows sleep apnea. I will place order for home cpap. She should follow up with me 1 month after starting cpap.

## 2017-10-06 NOTE — Telephone Encounter (Signed)
See phone note from earlier today. 

## 2017-10-06 NOTE — Telephone Encounter (Signed)
Notified pt and she voices understanding. [facility] has already reached out to her.

## 2017-10-07 NOTE — Telephone Encounter (Signed)
Opened in error

## 2017-10-11 ENCOUNTER — Encounter: Payer: Self-pay | Admitting: Obstetrics & Gynecology

## 2017-10-12 MED ORDER — FLUCONAZOLE 150 MG PO TABS: 150.0000 mg | ORAL_TABLET | Freq: Once | ORAL | 0 refills | Status: AC

## 2017-10-27 DIAGNOSIS — G4733 Obstructive sleep apnea (adult) (pediatric): Secondary | ICD-10-CM | POA: Diagnosis not present

## 2017-11-02 ENCOUNTER — Encounter: Payer: Self-pay | Admitting: Obstetrics & Gynecology

## 2017-11-03 ENCOUNTER — Encounter: Payer: Self-pay | Admitting: Obstetrics and Gynecology

## 2017-11-03 ENCOUNTER — Ambulatory Visit (INDEPENDENT_AMBULATORY_CARE_PROVIDER_SITE_OTHER): Payer: BLUE CROSS/BLUE SHIELD | Admitting: Obstetrics and Gynecology

## 2017-11-03 VITALS — BP 133/79 | HR 91 | Wt 232.2 lb

## 2017-11-03 DIAGNOSIS — Z113 Encounter for screening for infections with a predominantly sexual mode of transmission: Secondary | ICD-10-CM

## 2017-11-03 DIAGNOSIS — N898 Other specified noninflammatory disorders of vagina: Secondary | ICD-10-CM

## 2017-11-03 DIAGNOSIS — L290 Pruritus ani: Secondary | ICD-10-CM

## 2017-11-03 NOTE — Progress Notes (Signed)
   GYNECOLOGY OFFICE VISIT NOTE  History:  42 y.o. G1P1001 here today for thin white discharge for about 3 days. Malodorous, no itching. Similar to symptoms she had in January. Had intercourse about 3 weeks ago where condom came off, wants STI testing, declines HIV/RPR.  Also has occasional itching in rectum.  Past Medical History:  Diagnosis Date  . Anemia    ? history of anemia  . Hypertension    Past Surgical History:  Procedure Laterality Date  . APPENDECTOMY  2003  . LEG SURGERY     femoral rod placement s/p fracture 2005 left leg  . TOE SURGERY  2006   right great toe.   . TONSILLECTOMY  1980s    Current Outpatient Medications:  .  hydrochlorothiazide (HYDRODIURIL) 25 MG tablet, Take 1 tablet (25 mg total) by mouth daily., Disp: 90 tablet, Rfl: 1 .  levonorgestrel (MIRENA) 20 MCG/24HR IUD, 1 each by Intrauterine route once., Disp: , Rfl:  .  metroNIDAZOLE (FLAGYL) 500 MG tablet, Take 1 tablet (500 mg total) by mouth 2 (two) times daily. (Patient not taking: Reported on 11/03/2017), Disp: 14 tablet, Rfl: 0  The following portions of the patient's history were reviewed and updated as appropriate: allergies, current medications, past family history, past medical history, past social history, past surgical history and problem list.   Health Maintenance:  Last pap: 05/2017 negative Last mammogram: 06/2017, Birads 3, has follow up in 12/2017  Review of Systems:  Pertinent items noted in HPI and remainder of comprehensive ROS otherwise negative.   Objective:  Physical Exam BP 133/79 (BP Location: Right Arm)   Pulse 91   Wt 232 lb 3.2 oz (105.3 kg)   LMP  (LMP Unknown)   BMI 39.86 kg/m  CONSTITUTIONAL: Well-developed, well-nourished female in no acute distress.  HENT:  Normocephalic, atraumatic. External right and left ear normal. Oropharynx is clear and moist EYES: Conjunctivae and EOM are normal. Pupils are equal, round, and reactive to light. No scleral icterus.  NECK:  Normal range of motion, supple, no masses SKIN: Skin is warm and dry. No rash noted. Not diaphoretic. No erythema. No pallor. NEUROLOGIC: Alert and oriented to person, place, and time. Normal reflexes, muscle tone coordination. No cranial nerve deficit noted. PSYCHIATRIC: Normal mood and affect. Normal behavior. Normal judgment and thought content. CARDIOVASCULAR: Normal heart rate noted RESPIRATORY: Effort normal, no problems with respiration noted ABDOMEN: Soft, no distention noted.   PELVIC: normal appearing external female genitalia, normal appearing cervix and vaginal mucosa, moderae amount white discharge in vagina, small non-tender hemorrhoid noted at rectum MUSCULOSKELETAL: Normal range of motion. No edema noted.  Labs and Imaging No results found.  Assessment & Plan:   1. Vaginal discharge - Wet prep sent today  2. Rectal itching - Small hemorrhoid noted - recommended OTC cream prn   Routine preventative health maintenance measures emphasized. Please refer to After Visit Summary for other counseling recommendations.   Return in about 6 months (around 05/06/2018) for annual.   Feliz Beam, M.D. Attending [place], [facility] for [employer], [place]

## 2017-11-03 NOTE — Patient Instructions (Signed)
Bacterial Vaginosis Bacterial vaginosis is a vaginal infection that occurs when the normal balance of bacteria in the vagina is disrupted. It results from an overgrowth of certain bacteria. This is the most common vaginal infection among women ages 15-44. Because bacterial vaginosis increases your risk for STIs (sexually transmitted infections), getting treated can help reduce your risk for chlamydia, gonorrhea, herpes, and HIV (human immunodeficiency virus). Treatment is also important for preventing complications in pregnant women, because this condition can cause an early (premature) delivery. What are the causes? This condition is caused by an increase in harmful bacteria that are normally present in small amounts in the vagina. However, the reason that the condition develops is not fully understood. What increases the risk? The following factors may make you more likely to develop this condition:  Having a new sexual partner or multiple sexual partners.  Having unprotected sex.  Douching.  Having an intrauterine device (IUD).  Smoking.  Drug and alcohol abuse.  Taking certain antibiotic medicines.  Being pregnant.  You cannot get bacterial vaginosis from toilet seats, bedding, swimming pools, or contact with objects around you. What are the signs or symptoms? Symptoms of this condition include:  Grey or white vaginal discharge. The discharge can also be watery or foamy.  A fish-like odor with discharge, especially after sexual intercourse or during menstruation.  Itching in and around the vagina.  Burning or pain with urination.  Some women with bacterial vaginosis have no signs or symptoms. How is this diagnosed? This condition is diagnosed based on:  Your medical history.  A physical exam of the vagina.  Testing a sample of vaginal fluid under a microscope to look for a large amount of bad bacteria or abnormal cells. Your health care provider may use a cotton swab  or a small wooden spatula to collect the sample.  How is this treated? This condition is treated with antibiotics. These may be given as a pill, a vaginal cream, or a medicine that is put into the vagina (suppository). If the condition comes back after treatment, a second round of antibiotics may be needed. Follow these instructions at home: Medicines  Take over-the-counter and prescription medicines only as told by your health care provider.  Take or use your antibiotic as told by your health care provider. Do not stop taking or using the antibiotic even if you start to feel better. General instructions  If you have a female sexual partner, tell her that you have a vaginal infection. She should see her health care provider and be treated if she has symptoms. If you have a female sexual partner, he does not need treatment.  During treatment: ? Avoid sexual activity until you finish treatment. ? Do not douche. ? Avoid alcohol as directed by your health care provider. ? Avoid breastfeeding as directed by your health care provider.  Drink enough water and fluids to keep your urine clear or pale yellow.  Keep the area around your vagina and rectum clean. ? Wash the area daily with warm water. ? Wipe yourself from front to back after using the toilet.  Keep all follow-up visits as told by your health care provider. This is important. How is this prevented?  Do not douche.  Wash the outside of your vagina with warm water only.  Use protection when having sex. This includes latex condoms and dental dams.  Limit how many sexual partners you have. To help prevent bacterial vaginosis, it is best to have sex with just   one partner (monogamous).  Make sure you and your sexual partner are tested for STIs.  Wear cotton or cotton-lined underwear.  Avoid wearing tight pants and pantyhose, especially during summer.  Limit the amount of alcohol that you drink.  Do not use any products that  contain nicotine or tobacco, such as cigarettes and e-cigarettes. If you need help quitting, ask your health care provider.  Do not use illegal drugs. Where to find more information:  Centers for Disease Control and Prevention: www.cdc.gov/std  American Sexual Health Association (ASHA): www.ashastd.org  U.S. Department of Health and Human Services, Office on Women's Health: www.womenshealth.gov/ or https://www.womenshealth.gov/a-z-topics/bacterial-vaginosis Contact a health care provider if:  Your symptoms do not improve, even after treatment.  You have more discharge or pain when urinating.  You have a fever.  You have pain in your abdomen.  You have pain during sex.  You have vaginal bleeding between periods. Summary  Bacterial vaginosis is a vaginal infection that occurs when the normal balance of bacteria in the vagina is disrupted.  Because bacterial vaginosis increases your risk for STIs (sexually transmitted infections), getting treated can help reduce your risk for chlamydia, gonorrhea, herpes, and HIV (human immunodeficiency virus). Treatment is also important for preventing complications in pregnant women, because the condition can cause an early (premature) delivery.  This condition is treated with antibiotic medicines. These may be given as a pill, a vaginal cream, or a medicine that is put into the vagina (suppository). This information is not intended to replace advice given to you by your health care provider. Make sure you discuss any questions you have with your health care provider. Document Released: 08/03/2005 Document Revised: 12/07/2016 Document Reviewed: 04/18/2016 Elsevier Interactive Patient Education  2018 Elsevier Inc.  

## 2017-11-03 NOTE — Progress Notes (Signed)
gcPresents for white, malodorous discharge.  Denies itching, pain, NV, fever, chills.  BP elevated, she took her medication this morning.

## 2017-11-04 LAB — CERVICOVAGINAL ANCILLARY ONLY
Bacterial vaginitis: POSITIVE — AB
CHLAMYDIA, DNA PROBE: NEGATIVE
Candida vaginitis: NEGATIVE
Neisseria Gonorrhea: NEGATIVE
Trichomonas: NEGATIVE

## 2017-11-07 ENCOUNTER — Encounter: Payer: Self-pay | Admitting: Obstetrics and Gynecology

## 2017-11-07 MED ORDER — METRONIDAZOLE 0.75 % VA GEL
1.0000 | Freq: Every day | VAGINAL | 1 refills | Status: DC
Start: 2017-11-07 — End: 2017-12-06

## 2017-11-07 NOTE — Addendum Note (Signed)
Addended by: Vivien Rota on: 11/07/2017 04:21 PM   Modules accepted: Orders

## 2017-11-09 ENCOUNTER — Other Ambulatory Visit: Payer: Self-pay | Admitting: Obstetrics and Gynecology

## 2017-11-09 MED ORDER — FLUCONAZOLE 150 MG PO TABS
150.0000 mg | ORAL_TABLET | Freq: Once | ORAL | 1 refills | Status: AC
Start: 2017-11-09 — End: 2017-11-09

## 2017-11-09 NOTE — Progress Notes (Signed)
Diflucan sent to pharmacy.

## 2017-11-27 DIAGNOSIS — G4733 Obstructive sleep apnea (adult) (pediatric): Secondary | ICD-10-CM | POA: Diagnosis not present

## 2017-12-06 ENCOUNTER — Encounter: Payer: Self-pay | Admitting: Family

## 2017-12-06 ENCOUNTER — Ambulatory Visit (INDEPENDENT_AMBULATORY_CARE_PROVIDER_SITE_OTHER): Payer: BLUE CROSS/BLUE SHIELD | Admitting: Family

## 2017-12-06 ENCOUNTER — Telehealth: Payer: Self-pay | Admitting: Family

## 2017-12-06 ENCOUNTER — Ambulatory Visit: Payer: BLUE CROSS/BLUE SHIELD | Admitting: Family

## 2017-12-06 VITALS — BP 155/77 | HR 92 | Temp 99.0°F | Resp 18 | Ht 64.0 in | Wt 229.4 lb

## 2017-12-06 DIAGNOSIS — I10 Essential (primary) hypertension: Secondary | ICD-10-CM

## 2017-12-06 DIAGNOSIS — G4733 Obstructive sleep apnea (adult) (pediatric): Secondary | ICD-10-CM | POA: Diagnosis not present

## 2017-12-06 NOTE — Progress Notes (Signed)
Subjective:    Patient ID: Julia Morris, female    DOB: 1975-12-01, 42 y.o.   MRN: [id number]  HPI  Julia Morris is a 42 yr old female who presents today for follow up.  HTN- reports that she has missed a few days of her hctz.   BP Readings from Last 3 Encounters:  12/06/17 (!) 155/77  11/03/17 133/79  09/16/17 (!) 142/86   OSA-  Has CPAP provided [facility]. Reports good compliance with her cpap and improvement in her sleep and daytime somnolence. Feeling much better.   Review of Systems See HPI  Past Medical History:  Diagnosis Date  . Anemia    ? history of anemia  . Hypertension      Social History   Socioeconomic History  . Marital status: Single    Spouse name: Not on file  . Number of children: 1  . Years of education: Not on file  . Highest education level: Not on file  Occupational History    Employer: [employer]  Social Needs  . Financial resource strain: Not on file  . Food insecurity:    Worry: Not on file    Inability: Not on file  . Transportation needs:    Medical: Not on file    Non-medical: Not on file  Tobacco Use  . Smoking status: Never Smoker  . Smokeless tobacco: Never Used  Substance and Sexual Activity  . Alcohol use: Yes    Comment: occasional  . Drug use: No  . Sexual activity: Never    Partners: Male  Lifestyle  . Physical activity:    Days per week: Not on file    Minutes per session: Not on file  . Stress: Not on file  Relationships  . Social connections:    Talks on phone: Not on file    Gets together: Not on file    Attends religious service: Not on file    Active member of club or organization: Not on file    Attends meetings of clubs or organizations: Not on file    Relationship status: Not on file  . Intimate partner violence:    Fear of current or ex partner: Not on file    Emotionally abused: Not on file    Physically abused: Not on file    Forced sexual activity: Not on file  Other Topics Concern  .  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   Enjoys running/exercise.    Past Surgical History:  Procedure Laterality Date  . APPENDECTOMY  2003  . LEG SURGERY     femoral rod placement s/p fracture 2005 left leg  . TOE SURGERY  2006   right great toe.   . TONSILLECTOMY  1980s    Family History  Problem Relation Age of Onset  . Hypertension Mother   . Hyperlipidemia Mother   . Stroke Mother   . Hypertension Father   . Hyperlipidemia Father   . Diabetes Maternal Aunt   . Hypertension Maternal Aunt   . Hyperlipidemia Maternal Aunt   . Diabetes Maternal Uncle   . Hypertension Maternal Uncle   . Hyperlipidemia Maternal Uncle   . Diabetes Paternal Aunt   .  Hypertension Paternal Aunt   . Hyperlipidemia Paternal Aunt   . Diabetes Paternal Uncle   . Hypertension Paternal Uncle   . Hyperlipidemia Paternal Uncle   . Diabetes Maternal Grandmother   . Hypertension Maternal Grandmother   . Hyperlipidemia Maternal Grandmother   . Diabetes Maternal Grandfather   . Hypertension Maternal Grandfather   . Hyperlipidemia Maternal Grandfather   . Diabetes Paternal Grandmother   . Hypertension Paternal Grandmother   . Hyperlipidemia Paternal Grandmother   . Heart attack Neg Hx   . Sudden death Neg Hx     Allergies  Allergen Reactions  . Prednisone Swelling    Current Outpatient Medications on File Prior to Visit  Medication Sig Dispense Refill  . hydrochlorothiazide (HYDRODIURIL) 25 MG tablet Take 1 tablet (25 mg total) by mouth daily. 90 tablet 1  . levonorgestrel (MIRENA) 20 MCG/24HR IUD 1 each by Intrauterine route once.     No current facility-administered medications on file prior to visit.     BP (!) 155/77 (BP Location: Right Arm, Cuff Size: Large)   Pulse 92    Temp 99 F (37.2 C) (Oral)   Resp 18   Ht 5\' 4"  (1.626 m)   Wt 229 lb 6.4 oz (104.1 kg)   SpO2 98%   BMI 39.38 kg/m       Objective:   Physical Exam  Constitutional: She is oriented to person, place, and time. She appears well-developed and well-nourished.  HENT:  Head: Normocephalic and atraumatic.  Cardiovascular: Normal rate, regular rhythm and normal heart sounds.  No murmur heard. Pulmonary/Chest: Effort normal and breath sounds normal. No respiratory distress. She has no wheezes.  Musculoskeletal: She exhibits no edema.  Neurological: She is alert and oriented to person, place, and time.  Psychiatric: She has a normal mood and affect. Her behavior is normal. Judgment and thought content normal.          Assessment & Plan:  HTN- bp elevated but admits to fair compliance. Advised pt to take hctz daily. Check bp daily x 1 week then send me readings via mychart.  Obtain follow up bmet.   OSA- clinically improved on cpap. Will request download from [place].

## 2017-12-06 NOTE — Telephone Encounter (Signed)
Could you please call [facility] and request CPAP download?

## 2017-12-06 NOTE — Patient Instructions (Signed)
Continue hctz once every day. Check blood pressure daily x 1 week and send me via mychart.

## 2017-12-07 LAB — BASIC METABOLIC PANEL
BUN: 15 mg/dL (ref 6–23)
CALCIUM: 10.1 mg/dL (ref 8.4–10.5)
CO2: 24 mEq/L (ref 19–32)
CREATININE: 0.79 mg/dL (ref 0.40–1.20)
Chloride: 100 mEq/L (ref 96–112)
GFR: 103.07 mL/min (ref >60.00)
Glucose, Bld: 69 mg/dL — ABNORMAL LOW (ref 70–99)
Potassium: 3.4 mEq/L — ABNORMAL LOW (ref 3.5–5.1)
Sodium: 135 mEq/L (ref 135–145)

## 2017-12-07 NOTE — Telephone Encounter (Signed)
Download received and placed in PCP's yellow folder.

## 2017-12-07 NOTE — Telephone Encounter (Signed)
Spoke with Altha Harm and requested [facility].  Awaiting fax to nurse station.

## 2017-12-14 ENCOUNTER — Encounter: Payer: Self-pay | Admitting: Family

## 2017-12-27 ENCOUNTER — Encounter: Payer: Self-pay | Admitting: Family

## 2017-12-27 ENCOUNTER — Ambulatory Visit (INDEPENDENT_AMBULATORY_CARE_PROVIDER_SITE_OTHER): Payer: BLUE CROSS/BLUE SHIELD | Admitting: Family

## 2017-12-27 ENCOUNTER — Other Ambulatory Visit: Payer: BLUE CROSS/BLUE SHIELD

## 2017-12-27 VITALS — BP 150/100 | HR 95 | Temp 98.6°F | Resp 18 | Ht 64.0 in | Wt 227.4 lb

## 2017-12-27 DIAGNOSIS — G4733 Obstructive sleep apnea (adult) (pediatric): Secondary | ICD-10-CM | POA: Diagnosis not present

## 2017-12-27 DIAGNOSIS — J209 Acute bronchitis, unspecified: Secondary | ICD-10-CM

## 2017-12-27 MED ORDER — ALBUTEROL SULFATE HFA 108 (90 BASE) MCG/ACT IN AERS: 2.0000 | INHALATION_SPRAY | Freq: Four times a day (QID) | RESPIRATORY_TRACT | 0 refills | Status: DC | PRN

## 2017-12-27 MED ORDER — BENZONATATE 100 MG PO CAPS: 100.0000 mg | ORAL_CAPSULE | Freq: Three times a day (TID) | ORAL | 0 refills | Status: DC | PRN

## 2017-12-27 MED ORDER — AZITHROMYCIN 250 MG PO TABS: ORAL_TABLET | ORAL | 0 refills | Status: DC

## 2017-12-27 NOTE — Patient Instructions (Signed)
Please begin azithromycin (antibiotic) For wheezing you may use albuterol 2 puffs every 6 hours as needed. For cough you may use tessalon as needed. Call if new/worsening symptoms or if symptoms, if you develop fever >101 or if your symptoms are not improved in 2-3 days.

## 2017-12-27 NOTE — Progress Notes (Signed)
Subjective:    Patient ID: Julia Morris, female    DOB: 15-Jun-1976, 42 y.o.   MRN: [id number]  HPI   Patient is a 42 yr old female who presents today with chief complaint of cough. Report sthat cough has been present for 5 days. Has associated nasal congestion, temp 99-100 over weekend. Taking Nyquil, mucinex, cough drops. Reports no improvement with these measures.  Tmax 100 over the weekend.   HTN- BP remains elevated.  BP Readings from Last 3 Encounters:  12/27/17 (!) 150/100  12/06/17 (!) 155/77  11/03/17 133/79      Review of Systems See HPI  Past Medical History:  Diagnosis Date  . Anemia    ? history of anemia  . Hypertension      Social History   Socioeconomic History  . Marital status: Single    Spouse name: Not on file  . Number of children: 1  . Years of education: Not on file  . Highest education level: Not on file  Occupational History    Employer: [employer]  Social Needs  . Financial resource strain: Not on file  . Food insecurity:    Worry: Not on file    Inability: Not on file  . Transportation needs:    Medical: Not on file    Non-medical: Not on file  Tobacco Use  . Smoking status: Never Smoker  . Smokeless tobacco: Never Used  Substance and Sexual Activity  . Alcohol use: Yes    Comment: occasional  . Drug use: No  . Sexual activity: Never    Partners: Male  Lifestyle  . Physical activity:    Days per week: Not on file    Minutes per session: Not on file  . Stress: Not on file  Relationships  . Social connections:    Talks on phone: Not on file    Gets together: Not on file    Attends religious service: Not on file    Active member of club or organization: Not on file    Attends meetings of clubs or organizations: Not on file    Relationship status: Not on file  . Intimate partner violence:    Fear of current or ex partner: Not on file    Emotionally abused: Not on file    Physically abused: Not on file    Forced  sexual activity: Not on file  Other Topics Concern  . Not on file  Social History Narrative   Works as a Manufacturing engineer   Working on [place]- in [employer], working in [place]   1 child- daughter born in 1999   She lives with her parents and daughter, in process of getting ready to move out.   Enjoys running/exercise.    Past Surgical History:  Procedure Laterality Date  . APPENDECTOMY  2003  . LEG SURGERY     femoral rod placement s/p fracture 2005 left leg  . TOE SURGERY  2006   right great toe.   . TONSILLECTOMY  1980s    Family History  Problem Relation Age of Onset  . Hypertension Mother   . Hyperlipidemia Mother   . Stroke Mother   . Hypertension Father   . Hyperlipidemia Father   . Diabetes Maternal Aunt   . Hypertension Maternal Aunt   . Hyperlipidemia Maternal Aunt   . Diabetes Maternal Uncle   . Hypertension Maternal Uncle   . Hyperlipidemia Maternal Uncle   .  Diabetes Paternal Aunt   . Hypertension Paternal Aunt   . Hyperlipidemia Paternal Aunt   . Diabetes Paternal Uncle   . Hypertension Paternal Uncle   . Hyperlipidemia Paternal Uncle   . Diabetes Maternal Grandmother   . Hypertension Maternal Grandmother   . Hyperlipidemia Maternal Grandmother   . Diabetes Maternal Grandfather   . Hypertension Maternal Grandfather   . Hyperlipidemia Maternal Grandfather   . Diabetes Paternal Grandmother   . Hypertension Paternal Grandmother   . Hyperlipidemia Paternal Grandmother   . Heart attack Neg Hx   . Sudden death Neg Hx     Allergies  Allergen Reactions  . Prednisone Swelling    Current Outpatient Medications on File Prior to Visit  Medication Sig Dispense Refill  . hydrochlorothiazide (HYDRODIURIL) 25 MG tablet Take 1 tablet (25 mg total) by mouth daily. 90 tablet 1  . levonorgestrel (MIRENA) 20 MCG/24HR IUD 1 each by Intrauterine route once.     No current facility-administered medications on file prior to visit.     BP (!) 150/100  (BP Location: Left Arm, Cuff Size: Large)   Pulse 95   Temp 98.6 F (37 C) (Oral)   Resp 18   Ht 5\' 4"  (1.626 m)   Wt 227 lb 6.4 oz (103.1 kg)   SpO2 98%   BMI 39.03 kg/m       Objective:   Physical Exam  Constitutional: She is oriented to person, place, and time. She appears well-developed and well-nourished.  HENT:  Head: Normocephalic and atraumatic.  Cardiovascular: Normal rate, regular rhythm and normal heart sounds.  No murmur heard. Pulmonary/Chest: Effort normal. No respiratory distress. She has wheezes in the left middle field and the left lower field.  Musculoskeletal: She exhibits no edema.  Neurological: She is alert and oriented to person, place, and time.  Psychiatric: She has a normal mood and affect. Her behavior is normal. Judgment and thought content normal.          Assessment & Plan:  Bronchitis with bronchospasm- advised pt as follows:  Please begin azithromycin (antibiotic)  For wheezing you may use albuterol 2 puffs every 6 hours as needed. For cough you may use tessalon as needed. Call if new/worsening symptoms or if symptoms, if you develop fever >101 or if your symptoms are not improved in 2-3 days.

## 2018-01-03 ENCOUNTER — Ambulatory Visit
Admission: RE | Admit: 2018-01-03 | Discharge: 2018-01-03 | Disposition: A | Discharge status: Home / Self Care | Payer: BLUE CROSS/BLUE SHIELD | Source: Ambulatory Visit | Attending: Obstetrics & Gynecology | Admitting: Obstetrics & Gynecology

## 2018-01-03 DIAGNOSIS — N6489 Other specified disorders of breast: Secondary | ICD-10-CM | POA: Diagnosis not present

## 2018-01-03 DIAGNOSIS — N6320 Unspecified lump in the left breast, unspecified quadrant: Secondary | ICD-10-CM

## 2018-01-03 DIAGNOSIS — R928 Other abnormal and inconclusive findings on diagnostic imaging of breast: Secondary | ICD-10-CM | POA: Diagnosis not present

## 2018-01-27 DIAGNOSIS — G4733 Obstructive sleep apnea (adult) (pediatric): Secondary | ICD-10-CM | POA: Diagnosis not present

## 2018-01-31 ENCOUNTER — Telehealth: Payer: Self-pay | Admitting: Family

## 2018-01-31 ENCOUNTER — Ambulatory Visit (INDEPENDENT_AMBULATORY_CARE_PROVIDER_SITE_OTHER): Payer: BLUE CROSS/BLUE SHIELD | Admitting: Family

## 2018-01-31 ENCOUNTER — Encounter: Payer: Self-pay | Admitting: Family

## 2018-01-31 VITALS — BP 133/73 | HR 83 | Temp 98.6°F | Resp 18 | Ht 64.0 in | Wt 230.6 lb

## 2018-01-31 DIAGNOSIS — E876 Hypokalemia: Secondary | ICD-10-CM

## 2018-01-31 DIAGNOSIS — I10 Essential (primary) hypertension: Secondary | ICD-10-CM | POA: Diagnosis not present

## 2018-01-31 LAB — BASIC METABOLIC PANEL
BUN: 14 mg/dL (ref 6–23)
CO2: 25 mEq/L (ref 19–32)
Calcium: 9.5 mg/dL (ref 8.4–10.5)
Chloride: 102 mEq/L (ref 96–112)
Creatinine, Ser: 0.76 mg/dL (ref 0.40–1.20)
GFR: 107.7 mL/min (ref >60.00)
Glucose, Bld: 122 mg/dL — ABNORMAL HIGH (ref 70–99)
Potassium: 3.3 mEq/L — ABNORMAL LOW (ref 3.5–5.1)
Sodium: 138 mEq/L (ref 135–145)

## 2018-01-31 MED ORDER — POTASSIUM CHLORIDE CRYS ER 20 MEQ PO TBCR: 20.0000 meq | EXTENDED_RELEASE_TABLET | Freq: Every day | ORAL | 3 refills | Status: DC

## 2018-01-31 MED ORDER — HYDROCHLOROTHIAZIDE 25 MG PO TABS: 25.0000 mg | ORAL_TABLET | Freq: Every day | ORAL | 1 refills | Status: DC

## 2018-01-31 NOTE — Patient Instructions (Signed)
Please complete lab work prior to leaving,

## 2018-01-31 NOTE — Telephone Encounter (Signed)
Potassium mildly low. Please begin kdur 42meq once daily, repeat bmet in 1 week, dx hypokalemia.

## 2018-01-31 NOTE — Telephone Encounter (Signed)
Notified pt and scheduled lab appt for 02/08/18 at 1:30pm. Future order entered.

## 2018-01-31 NOTE — Progress Notes (Signed)
Subjective:    Patient ID: Julia Morris, female    DOB: [birth year], 42 y.o.   MRN: [id number]  HPI  Pt is a 42 yr old female who presents today for follow up of her blood pressure. She is maintained on hctz 25mg . She reports that since last visit she has had improved compliance with her hctz.  BP Readings from Last 3 Encounters:  01/31/18 133/73  12/27/17 (!) 150/100  12/06/17 (!) 155/77     Review of Systems   see HPI  Past Medical History:  Diagnosis Date  . Anemia    ? history of anemia  . Hypertension      Social History   Socioeconomic History  . Marital status: Single    Spouse name: Not on file  . Number of children: 1  . Years of education: Not on file  . Highest education level: Not on file  Occupational History    Employer: [employer]  Social Needs  . Financial resource strain: Not on file  . Food insecurity:    Worry: Not on file    Inability: Not on file  . Transportation needs:    Medical: Not on file    Non-medical: Not on file  Tobacco Use  . Smoking status: Never Smoker  . Smokeless tobacco: Never Used  Substance and Sexual Activity  . Alcohol use: Yes    Comment: occasional  . Drug use: No  . Sexual activity: Never    Partners: Male  Lifestyle  . Physical activity:    Days per week: Not on file    Minutes per session: Not on file  . Stress: Not on file  Relationships  . Social connections:    Talks on phone: Not on file    Gets together: Not on file    Attends religious service: Not on file    Active member of club or organization: Not on file    Attends meetings of clubs or organizations: Not on file    Relationship status: Not on file  . Intimate partner violence:    Fear of current or ex partner: Not on file    Emotionally abused: Not on file    Physically abused: Not on file    Forced sexual activity: Not on file  Other Topics Concern  . Not on file  Social History Narrative   Works as a Manufacturing engineer   Working on [place]- in [employer], working in [place]   1 child- daughter born in 1999   She lives with her parents and daughter, in process of getting ready to move out.   Enjoys running/exercise.    Past Surgical History:  Procedure Laterality Date  . APPENDECTOMY  2003  . LEG SURGERY     femoral rod placement s/p fracture 2005 left leg  . TOE SURGERY  2006   right great toe.   . TONSILLECTOMY  1980s    Family History  Problem Relation Age of Onset  . Hypertension Mother   . Hyperlipidemia Mother   . Stroke Mother   . Hypertension Father   . Hyperlipidemia Father   . Diabetes Maternal Aunt   . Hypertension Maternal Aunt   . Hyperlipidemia Maternal Aunt   . Diabetes Maternal Uncle   . Hypertension Maternal Uncle   . Hyperlipidemia Maternal Uncle   . Diabetes Paternal Aunt   . Hypertension Paternal Aunt   . Hyperlipidemia Paternal Aunt   . Diabetes  Paternal Uncle   . Hypertension Paternal Uncle   . Hyperlipidemia Paternal Uncle   . Diabetes Maternal Grandmother   . Hypertension Maternal Grandmother   . Hyperlipidemia Maternal Grandmother   . Diabetes Maternal Grandfather   . Hypertension Maternal Grandfather   . Hyperlipidemia Maternal Grandfather   . Diabetes Paternal Grandmother   . Hypertension Paternal Grandmother   . Hyperlipidemia Paternal Grandmother   . Heart attack Neg Hx   . Sudden death Neg Hx     Allergies  Allergen Reactions  . Prednisone Swelling    Current Outpatient Medications on File Prior to Visit  Medication Sig Dispense Refill  . hydrochlorothiazide (HYDRODIURIL) 25 MG tablet Take 1 tablet (25 mg total) by mouth daily. 90 tablet 1  . levonorgestrel (MIRENA) 20 MCG/24HR IUD 1 each by Intrauterine route once.     No current facility-administered medications on file prior to visit.     BP 133/73 (BP Location: Right Arm, Cuff Size: Large)   Pulse 83   Temp 98.6 F (37 C) (Oral)   Resp 18   Ht 5\' 4"  (1.626 m)   Wt 230 lb 9.6  oz (104.6 kg)   LMP  (LMP Unknown)   SpO2 98%   BMI 39.58 kg/m    Objective:   Physical Exam  Constitutional: She appears well-developed and well-nourished.  Cardiovascular: Normal rate, regular rhythm and normal heart sounds.  No murmur heard. Pulmonary/Chest: Effort normal and breath sounds normal. No respiratory distress. She has no wheezes.  Psychiatric: She has a normal mood and affect. Her behavior is normal. Judgment and thought content normal.          Assessment & Plan:  HTN- improved.  Continue hctz, reinforced importance of compliance.  Obtain follow up hctz.

## 2018-02-08 ENCOUNTER — Other Ambulatory Visit (INDEPENDENT_AMBULATORY_CARE_PROVIDER_SITE_OTHER): Payer: BLUE CROSS/BLUE SHIELD

## 2018-02-08 DIAGNOSIS — E876 Hypokalemia: Secondary | ICD-10-CM

## 2018-02-08 LAB — BASIC METABOLIC PANEL
BUN: 17 mg/dL (ref 6–23)
CALCIUM: 9.4 mg/dL (ref 8.4–10.5)
CO2: 25 meq/L (ref 19–32)
CREATININE: 0.72 mg/dL (ref 0.40–1.20)
Chloride: 102 mEq/L (ref 96–112)
GFR: 114.63 mL/min (ref >60.00)
GLUCOSE: 84 mg/dL (ref 70–99)
POTASSIUM: 3.9 meq/L (ref 3.5–5.1)
SODIUM: 137 meq/L (ref 135–145)

## 2018-02-09 ENCOUNTER — Encounter: Payer: Self-pay | Admitting: Family

## 2018-02-26 DIAGNOSIS — G4733 Obstructive sleep apnea (adult) (pediatric): Secondary | ICD-10-CM | POA: Diagnosis not present

## 2018-03-14 ENCOUNTER — Ambulatory Visit: Payer: BLUE CROSS/BLUE SHIELD | Admitting: Family

## 2018-03-29 DIAGNOSIS — G4733 Obstructive sleep apnea (adult) (pediatric): Secondary | ICD-10-CM | POA: Diagnosis not present

## 2018-04-29 DIAGNOSIS — G4733 Obstructive sleep apnea (adult) (pediatric): Secondary | ICD-10-CM | POA: Diagnosis not present

## 2018-05-02 ENCOUNTER — Ambulatory Visit: Payer: BLUE CROSS/BLUE SHIELD | Admitting: Family

## 2018-05-29 DIAGNOSIS — G4733 Obstructive sleep apnea (adult) (pediatric): Secondary | ICD-10-CM | POA: Diagnosis not present

## 2018-06-17 ENCOUNTER — Other Ambulatory Visit: Payer: Self-pay

## 2018-06-17 DIAGNOSIS — N898 Other specified noninflammatory disorders of vagina: Secondary | ICD-10-CM

## 2018-06-17 MED ORDER — METRONIDAZOLE 500 MG PO TABS: 500.0000 mg | ORAL_TABLET | Freq: Two times a day (BID) | ORAL | 0 refills | Status: DC

## 2018-06-17 NOTE — Progress Notes (Signed)
Pt messaged in stating she is having thin milky discharge with an odor. Flagyl sent per protocol. Pt instructed to contact us if symptoms do not improve.

## 2018-06-29 DIAGNOSIS — G4733 Obstructive sleep apnea (adult) (pediatric): Secondary | ICD-10-CM | POA: Diagnosis not present

## 2018-07-29 DIAGNOSIS — G4733 Obstructive sleep apnea (adult) (pediatric): Secondary | ICD-10-CM | POA: Diagnosis not present

## 2018-08-25 DIAGNOSIS — G4733 Obstructive sleep apnea (adult) (pediatric): Secondary | ICD-10-CM | POA: Diagnosis not present

## 2018-09-16 ENCOUNTER — Ambulatory Visit (INDEPENDENT_AMBULATORY_CARE_PROVIDER_SITE_OTHER): Payer: BLUE CROSS/BLUE SHIELD | Admitting: Medical

## 2018-09-16 ENCOUNTER — Encounter: Payer: Self-pay | Admitting: Medical

## 2018-09-16 VITALS — BP 141/82 | HR 97 | Temp 99.3°F | Resp 16 | Ht 64.0 in | Wt 245.8 lb

## 2018-09-16 DIAGNOSIS — J101 Influenza due to other identified influenza virus with other respiratory manifestations: Secondary | ICD-10-CM | POA: Diagnosis not present

## 2018-09-16 DIAGNOSIS — R059 Cough, unspecified: Secondary | ICD-10-CM

## 2018-09-16 DIAGNOSIS — M791 Myalgia, unspecified site: Secondary | ICD-10-CM | POA: Diagnosis not present

## 2018-09-16 DIAGNOSIS — R05 Cough: Secondary | ICD-10-CM | POA: Diagnosis not present

## 2018-09-16 LAB — POC INFLUENZA A&B (BINAX/QUICKVUE)
INFLUENZA A, POC: POSITIVE — AB
INFLUENZA B, POC: NEGATIVE

## 2018-09-16 MED ORDER — OSELTAMIVIR PHOSPHATE 75 MG PO CAPS: 75.0000 mg | ORAL_CAPSULE | Freq: Two times a day (BID) | ORAL | 0 refills | Status: DC

## 2018-09-16 MED ORDER — BENZONATATE 100 MG PO CAPS: 100.0000 mg | ORAL_CAPSULE | Freq: Three times a day (TID) | ORAL | 0 refills | Status: AC | PRN

## 2018-09-16 NOTE — Patient Instructions (Signed)
You do have flu type a.  Early in the course of the illness.  I am prescribing Tamiflu.  Recommend that you start it today.  For cough, I prescribed benzonatate.  For nasal congestion, you can use Afrin over-the-counter but strict advisement not to use more than 2days.  More than 2 days use can lead to rebound nasal congestion.  Consider prescribing a Flonase but you have reported allergies to prednisone.  Return to work this coming Tuesday provided that you have no fever and feel better.  If you get any secondary signs of infection as discussed then notify us as sometimes antibiotics needed for secondary infections.  Follow-up in 7 days or as needed.

## 2018-09-16 NOTE — Progress Notes (Signed)
   Subjective:    Patient ID: Julia Morris, female    DOB: 15-Sep-1975, 43 y.o.   MRN: [id number]  HPI  Pt in with body aches, chills, fever, wheezing and fatigue. Pt symptoms came on in one day. Some dry cough.  No hx of asthma. But little wheeze intermittent.  lmp- on mirena.  Pt did not get flu vaccine this year.   Review of Systems  Constitutional: Positive for chills, fatigue and fever.  HENT: Negative for congestion, postnasal drip, rhinorrhea, sinus pressure and sinus pain.   Respiratory: Positive for cough. Negative for shortness of breath and wheezing.   Cardiovascular: Negative for chest pain and palpitations.  Gastrointestinal: Negative for abdominal pain.  Musculoskeletal: Positive for myalgias.  Skin: Negative for rash.  Neurological: Negative for dizziness and headaches.  Hematological: Negative for adenopathy. Does not bruise/bleed easily.  Psychiatric/Behavioral: Negative for behavioral problems and confusion.       Objective:   Physical Exam  General  Mental Status - Alert. General Appearance - Well groomed. Not in acute distress.  Skin Rashes- No Rashes.  HEENT Head- Normal. Ear Auditory Canal - Left- Normal. Right - Normal.Tympanic Membrane- Left- Normal. Right- Normal. Eye Sclera/Conjunctiva- Left- Normal. Right- Normal. Nose & Sinuses Nasal Mucosa- Left-  Boggy and Congested. Right-  Boggy and  Congested.Bilateral no  maxillary and no  frontal sinus pressure. Mouth & Throat Lips: Upper Lip- Normal: no dryness, cracking, pallor, cyanosis, or vesicular eruption. Lower Lip-Normal: no dryness, cracking, pallor, cyanosis or vesicular eruption. Buccal Mucosa- Bilateral- No Aphthous ulcers. Oropharynx- No Discharge or Erythema. Tonsils: Characteristics- Bilateral- No Erythema or Congestion. Size/Enlargement- Bilateral- No enlargement. Discharge- bilateral-None.  Neck Neck- Supple. No Masses.   Chest and Lung Exam Auscultation: Breath  Sounds:-Clear even and unlabored.  Cardiovascular Auscultation:Rythm- Regular, rate and rhythm. Murmurs & Other Heart Sounds:Ausculatation of the heart reveal- No Murmurs.  Lymphatic Head & Neck General Head & Neck Lymphatics: Bilateral: Description- No Localized lymphadenopathy.       Assessment & Plan:  You do have flu type a.  Early in the course of the illness.  I am prescribing Tamiflu.  Recommend that you start it today.  For cough, I prescribed benzonatate.  For nasal congestion, you can use Afrin over-the-counter but strict advisement not to use more than 2days.  More than 2 days use can lead to rebound nasal congestion.  Consider prescribing a Flonase but you have reported allergies to prednisone.  Return to work this coming Tuesday provided that you have no fever and feel better.  If you get any secondary signs of infection as discussed then notify us as sometimes antibiotics needed for secondary infections.  Follow-up in 7 days or as needed.  Mackie Pai, PA-C

## 2018-09-17 ENCOUNTER — Encounter (HOSPITAL_BASED_OUTPATIENT_CLINIC_OR_DEPARTMENT_OTHER): Payer: Self-pay | Admitting: *Deleted

## 2018-09-17 ENCOUNTER — Emergency Department (HOSPITAL_BASED_OUTPATIENT_CLINIC_OR_DEPARTMENT_OTHER): Payer: BLUE CROSS/BLUE SHIELD

## 2018-09-17 ENCOUNTER — Other Ambulatory Visit: Payer: Self-pay

## 2018-09-17 ENCOUNTER — Inpatient Hospital Stay (HOSPITAL_BASED_OUTPATIENT_CLINIC_OR_DEPARTMENT_OTHER)
Admission: EM | Admit: 2018-09-17 | Discharge: 2018-09-22 | DRG: 854 | Disposition: A | Discharge status: Home / Self Care | Payer: BLUE CROSS/BLUE SHIELD | Attending: Internal Medicine | Admitting: Internal Medicine

## 2018-09-17 DIAGNOSIS — G4733 Obstructive sleep apnea (adult) (pediatric): Secondary | ICD-10-CM | POA: Diagnosis present

## 2018-09-17 DIAGNOSIS — Z833 Family history of diabetes mellitus: Secondary | ICD-10-CM

## 2018-09-17 DIAGNOSIS — Z8249 Family history of ischemic heart disease and other diseases of the circulatory system: Secondary | ICD-10-CM

## 2018-09-17 DIAGNOSIS — Z975 Presence of (intrauterine) contraceptive device: Secondary | ICD-10-CM | POA: Diagnosis not present

## 2018-09-17 DIAGNOSIS — J111 Influenza due to unidentified influenza virus with other respiratory manifestations: Secondary | ICD-10-CM | POA: Diagnosis not present

## 2018-09-17 DIAGNOSIS — A419 Sepsis, unspecified organism: Secondary | ICD-10-CM | POA: Diagnosis not present

## 2018-09-17 DIAGNOSIS — E876 Hypokalemia: Secondary | ICD-10-CM | POA: Diagnosis not present

## 2018-09-17 DIAGNOSIS — Z8349 Family history of other endocrine, nutritional and metabolic diseases: Secondary | ICD-10-CM | POA: Diagnosis not present

## 2018-09-17 DIAGNOSIS — N136 Pyonephrosis: Secondary | ICD-10-CM | POA: Diagnosis not present

## 2018-09-17 DIAGNOSIS — N1330 Unspecified hydronephrosis: Secondary | ICD-10-CM | POA: Diagnosis present

## 2018-09-17 DIAGNOSIS — N201 Calculus of ureter: Secondary | ICD-10-CM | POA: Diagnosis present

## 2018-09-17 DIAGNOSIS — N186 End stage renal disease: Secondary | ICD-10-CM | POA: Diagnosis not present

## 2018-09-17 DIAGNOSIS — I10 Essential (primary) hypertension: Secondary | ICD-10-CM | POA: Diagnosis not present

## 2018-09-17 DIAGNOSIS — N12 Tubulo-interstitial nephritis, not specified as acute or chronic: Secondary | ICD-10-CM | POA: Diagnosis present

## 2018-09-17 DIAGNOSIS — Z6841 Body Mass Index (BMI) 40.0 and over, adult: Secondary | ICD-10-CM

## 2018-09-17 DIAGNOSIS — J45909 Unspecified asthma, uncomplicated: Secondary | ICD-10-CM | POA: Diagnosis present

## 2018-09-17 DIAGNOSIS — J101 Influenza due to other identified influenza virus with other respiratory manifestations: Secondary | ICD-10-CM | POA: Diagnosis not present

## 2018-09-17 DIAGNOSIS — N179 Acute kidney failure, unspecified: Secondary | ICD-10-CM | POA: Diagnosis not present

## 2018-09-17 DIAGNOSIS — N200 Calculus of kidney: Secondary | ICD-10-CM | POA: Diagnosis not present

## 2018-09-17 DIAGNOSIS — Z823 Family history of stroke: Secondary | ICD-10-CM

## 2018-09-17 DIAGNOSIS — Z888 Allergy status to other drugs, medicaments and biological substances status: Secondary | ICD-10-CM

## 2018-09-17 DIAGNOSIS — N132 Hydronephrosis with renal and ureteral calculous obstruction: Secondary | ICD-10-CM | POA: Diagnosis not present

## 2018-09-17 DIAGNOSIS — K760 Fatty (change of) liver, not elsewhere classified: Secondary | ICD-10-CM | POA: Diagnosis not present

## 2018-09-17 DIAGNOSIS — E860 Dehydration: Secondary | ICD-10-CM | POA: Diagnosis not present

## 2018-09-17 LAB — COMPREHENSIVE METABOLIC PANEL
ALBUMIN: 3.8 g/dL (ref 3.5–5.0)
ALT: 49 U/L — ABNORMAL HIGH (ref 0–44)
ANION GAP: 14 (ref 5–15)
AST: 86 U/L — AB (ref 15–41)
Alkaline Phosphatase: 62 U/L (ref 38–126)
BUN: 22 mg/dL — ABNORMAL HIGH (ref 6–20)
CHLORIDE: 95 mmol/L — AB (ref 98–111)
CO2: 20 mmol/L — AB (ref 22–32)
Calcium: 8.5 mg/dL — ABNORMAL LOW (ref 8.9–10.3)
Creatinine, Ser: 2.28 mg/dL — ABNORMAL HIGH (ref 0.44–1.00)
GFR calc Af Amer: 30 mL/min — ABNORMAL LOW (ref >60)
GFR calc non Af Amer: 26 mL/min — ABNORMAL LOW (ref >60)
GLUCOSE: 93 mg/dL (ref 70–99)
POTASSIUM: 2.9 mmol/L — AB (ref 3.5–5.1)
Sodium: 129 mmol/L — ABNORMAL LOW (ref 135–145)
TOTAL PROTEIN: 8.2 g/dL — AB (ref 6.5–8.1)
Total Bilirubin: 0.9 mg/dL (ref 0.3–1.2)

## 2018-09-17 LAB — URINALYSIS, MICROSCOPIC (REFLEX): WBC UA: NONE SEEN WBC/hpf (ref 0–5)

## 2018-09-17 LAB — CBC WITH DIFFERENTIAL/PLATELET
Abs Immature Granulocytes: 0.1 10*3/uL — ABNORMAL HIGH (ref 0.00–0.07)
Basophils Absolute: 0 10*3/uL (ref 0.0–0.1)
Basophils Relative: 0 %
EOS PCT: 0 %
Eosinophils Absolute: 0 10*3/uL (ref 0.0–0.5)
HEMATOCRIT: 43.1 % (ref 36.0–46.0)
HEMOGLOBIN: 13.7 g/dL (ref 12.0–15.0)
IMMATURE GRANULOCYTES: 1 %
LYMPHS PCT: 3 %
Lymphs Abs: 0.6 10*3/uL — ABNORMAL LOW (ref 0.7–4.0)
MCH: 26.9 pg (ref 26.0–34.0)
MCHC: 31.8 g/dL (ref 30.0–36.0)
MCV: 84.7 fL (ref 80.0–100.0)
MONO ABS: 0.2 10*3/uL (ref 0.1–1.0)
MONOS PCT: 1 %
Neutro Abs: 15.9 10*3/uL — ABNORMAL HIGH (ref 1.7–7.7)
Neutrophils Relative %: 95 %
Platelets: 173 10*3/uL (ref 150–400)
RBC: 5.09 MIL/uL (ref 3.87–5.11)
RDW: 14.3 % (ref 11.5–15.5)
WBC: 16.8 10*3/uL — AB (ref 4.0–10.5)
nRBC: 0 % (ref 0.0–0.2)

## 2018-09-17 LAB — URINALYSIS, ROUTINE W REFLEX MICROSCOPIC
Glucose, UA: NEGATIVE mg/dL
KETONES UR: NEGATIVE mg/dL
LEUKOCYTES UA: NEGATIVE
NITRITE: NEGATIVE
Protein, ur: 100 mg/dL — AB
pH: 5.5 (ref 5.0–8.0)

## 2018-09-17 LAB — LACTIC ACID, PLASMA: Lactic Acid, Venous: 1.8 mmol/L (ref 0.5–1.9)

## 2018-09-17 LAB — MAGNESIUM: Magnesium: 1.4 mg/dL — ABNORMAL LOW (ref 1.7–2.4)

## 2018-09-17 LAB — PREGNANCY, URINE: PREG TEST UR: NEGATIVE

## 2018-09-17 MED ORDER — SODIUM CHLORIDE 0.9 % IV BOLUS
1000.0000 mL | Freq: Once | INTRAVENOUS | Status: AC
  Administered 2018-09-17: 1000 mL via INTRAVENOUS

## 2018-09-17 MED ORDER — POTASSIUM CHLORIDE 10 MEQ/100ML IV SOLN
10.0000 meq | Freq: Once | INTRAVENOUS | Status: AC
  Administered 2018-09-17: 10 meq via INTRAVENOUS
  Filled 2018-09-17: qty 100

## 2018-09-17 MED ORDER — ACETAMINOPHEN 325 MG PO TABS
650.0000 mg | ORAL_TABLET | Freq: Once | ORAL | Status: AC | PRN
  Administered 2018-09-17: 650 mg via ORAL
  Filled 2018-09-17: qty 2

## 2018-09-17 MED ORDER — IBUPROFEN 400 MG PO TABS
600.0000 mg | ORAL_TABLET | Freq: Once | ORAL | Status: AC
  Administered 2018-09-17: 600 mg via ORAL
  Filled 2018-09-17: qty 1

## 2018-09-17 MED ORDER — SODIUM CHLORIDE 0.9 % IV SOLN
1.0000 g | Freq: Once | INTRAVENOUS | Status: AC
  Administered 2018-09-17: 1 g via INTRAVENOUS
  Filled 2018-09-17: qty 10

## 2018-09-17 MED ORDER — SODIUM CHLORIDE 0.9 % IV BOLUS
1000.0000 mL | Freq: Once | INTRAVENOUS | Status: AC
  Administered 2018-09-18: 1000 mL via INTRAVENOUS

## 2018-09-17 NOTE — Plan of Care (Signed)
[facility] PLAN OF CARE NOTE TRANSFER - ADMISSION  FROM: [facility]  TO: [facility]   PATIENT ID/MRN/DOB: Julia Morris  [id number] 01-12-1976     BRIEF SUMMARY: Julia Morris is a 43 y.o. female with hx of HTN and recent diagnosis of influenza (type A at PCP office on 09/16/18) and tamiflu prescription who presented with progressive R flank plan, difficulty urinating, and worsening fevers/chills. She presented to [facility] ED. Exam per report and chart review notable for fever 102.8, mild tachycardia, and BP range 90-100/50s. CT abd/pelvis non contrast showed R hydronephrosis with R perinephric stranding and 30mm stone at R UVJ. Her UA shows large Hb but otherwise negative. Creatinine elevated to 2.28 from prior Cr 0.7.    DDx includes urinary obstruction due to R ureterovesical junction stone and possible pyelonephritis (although UA does not support pyelo at this time). Complicated by AKI with Cr 2.28. Influenza alone less likely to explain urinary sx and imaging results.    PLAN: - admit/transfer to [facility] med-surg floor  - IVF and empiric abx - continue tamiflu for influenza A(+) - contact urology in AM when patient arrives (urology on-call [facility] aware of patient)   Colbert Ewing, MD Pager [phone] Go to [email or web address] - [email or web address] for contact info [facility] - Office  [phone]

## 2018-09-17 NOTE — ED Notes (Addendum)
Unable to obtain second blood culture at this time, will reattempt after fluid admin. Also need more urine for culture, will attempt to collect when pt voids next.

## 2018-09-17 NOTE — ED Triage Notes (Signed)
Pt reports she was dx with the flu yesterday at her PCP and started on tamiflu. States since then she had had left flank pain and difficulty urinating

## 2018-09-17 NOTE — ED Notes (Signed)
ED Provider at bedside. 

## 2018-09-17 NOTE — ED Provider Notes (Addendum)
[facility] Provider Note   CSN: [id number] Arrival date & time: 09/17/18  1600     History   Chief Complaint Chief Complaint  Patient presents with  . Flank Pain    HPI Julia Morris is a 43 y.o. female.  Patient diagnosed with influenza yesterday started on Tamiflu.  Patient's had symptoms of the flu since Thursday.  Patient developed more sudden onset right flank pain and difficulty urinating.  This is new for the patient no history of this or history of kidney stone.  No injuries recalled.     Past Medical History:  Diagnosis Date  . Anemia    ? history of anemia  . Hypertension     Patient Active Problem List   Diagnosis Date Noted  . Kidney stone 09/17/2018  . OSA (obstructive sleep apnea) 07/05/2014  . Sinusitis, acute maxillary 07/05/2014  . Tinea pedis 02/13/2013  . Routine general medical examination at a health care facility 01/16/2013  . HTN (hypertension) 10/18/2012  . Headache(784.0) 09/29/2012    Past Surgical History:  Procedure Laterality Date  . APPENDECTOMY  2003  . LEG SURGERY     femoral rod placement s/p fracture 2005 left leg  . TOE SURGERY  2006   right great toe.   . TONSILLECTOMY  1980s     OB History    Gravida  1   Para  1   Term  1   Preterm  0   AB  0   Living  0     SAB  0   TAB  0   Ectopic  0   Multiple  0   Live Births  0            Home Medications    Prior to Admission medications   Medication Sig Start Date End Date Taking? Authorizing Provider  benzonatate (TESSALON) 100 MG capsule Take 1 capsule (100 mg total) by mouth 3 (three) times daily as needed. 09/16/18   Saguier, Percell Miller, PA-C  hydrochlorothiazide (HYDRODIURIL) 25 MG tablet Take 1 tablet (25 mg total) by mouth daily. 01/31/18   Debbrah Alar, NP  levonorgestrel (MIRENA) 20 MCG/24HR IUD 1 each by Intrauterine route once.    [provider]  oseltamivir (TAMIFLU) 75 MG capsule Take 1  capsule (75 mg total) by mouth 2 (two) times daily. 09/16/18   Saguier, Percell Miller, PA-C    Family History Family History  Problem Relation Age of Onset  . Hypertension Mother   . Hyperlipidemia Mother   . Stroke Mother   . Hypertension Father   . Hyperlipidemia Father   . Diabetes Maternal Aunt   . Hypertension Maternal Aunt   . Hyperlipidemia Maternal Aunt   . Diabetes Maternal Uncle   . Hypertension Maternal Uncle   . Hyperlipidemia Maternal Uncle   . Diabetes Paternal Aunt   . Hypertension Paternal Aunt   . Hyperlipidemia Paternal Aunt   . Diabetes Paternal Uncle   . Hypertension Paternal Uncle   . Hyperlipidemia Paternal Uncle   . Diabetes Maternal Grandmother   . Hypertension Maternal Grandmother   . Hyperlipidemia Maternal Grandmother   . Diabetes Maternal Grandfather   . Hypertension Maternal Grandfather   . Hyperlipidemia Maternal Grandfather   . Diabetes Paternal Grandmother   . Hypertension Paternal Grandmother   . Hyperlipidemia Paternal Grandmother   . Heart attack Neg Hx   . Sudden death Neg Hx     Social History Social History  Tobacco Use  . Smoking status: Never Smoker  . Smokeless tobacco: Never Used  Substance Use Topics  . Alcohol use: Yes    Comment: occasional  . Drug use: No     Allergies   Prednisone   Review of Systems Review of Systems  Constitutional: Positive for chills and fever.  HENT: Negative for congestion.   Eyes: Negative for visual disturbance.  Respiratory: Negative for shortness of breath.   Cardiovascular: Negative for chest pain.  Gastrointestinal: Positive for vomiting. Negative for abdominal pain.  Genitourinary: Positive for difficulty urinating and flank pain. Negative for dysuria.  Musculoskeletal: Negative for back pain, neck pain and neck stiffness.  Skin: Negative for rash.  Neurological: Negative for light-headedness and headaches.     Physical Exam Updated Vital Signs BP (!) 87/60 (BP Location: Left  Arm)   Pulse (!) 106   Temp 98.6 F (37 C) (Oral)   Resp 18   Ht 5\' 4"  (1.626 m)   Wt 111.5 kg   SpO2 100%   BMI 42.19 kg/m   Physical Exam Vitals signs and nursing note reviewed.  Constitutional:      Appearance: She is well-developed.  HENT:     Head: Normocephalic and atraumatic.     Comments: Dry mm Eyes:     General:        Right eye: No discharge.        Left eye: No discharge.     Conjunctiva/sclera: Conjunctivae normal.  Neck:     Musculoskeletal: Normal range of motion and neck supple.     Trachea: No tracheal deviation.  Cardiovascular:     Rate and Rhythm: Regular rhythm. Tachycardia present.  Pulmonary:     Effort: Pulmonary effort is normal.     Breath sounds: Normal breath sounds.  Abdominal:     General: There is no distension.     Palpations: Abdomen is soft.     Tenderness: There is abdominal tenderness (mild right posterior flank). There is no guarding.  Skin:    General: Skin is warm.     Findings: No rash.  Neurological:     Mental Status: She is alert and oriented to person, place, and time.      ED Treatments / Results  Labs (all labs ordered are listed, but only abnormal results are displayed) Labs Reviewed  URINALYSIS, ROUTINE W REFLEX MICROSCOPIC - Abnormal; Notable for the following components:      Result Value   Specific Gravity, Urine >1.030 (*)    Hgb urine dipstick LARGE (*)    Bilirubin Urine SMALL (*)    Protein, ur 100 (*)    All other components within normal limits  URINALYSIS, MICROSCOPIC (REFLEX) - Abnormal; Notable for the following components:   Bacteria, UA RARE (*)    All other components within normal limits  CBC WITH DIFFERENTIAL/PLATELET - Abnormal; Notable for the following components:   WBC 16.8 (*)    Neutro Abs 15.9 (*)    Lymphs Abs 0.6 (*)    Abs Immature Granulocytes 0.10 (*)    All other components within normal limits  COMPREHENSIVE METABOLIC PANEL - Abnormal; Notable for the following components:    Sodium 129 (*)    Potassium 2.9 (*)    Chloride 95 (*)    CO2 20 (*)    BUN 22 (*)    Creatinine, Ser 2.28 (*)    Calcium 8.5 (*)    Total Protein 8.2 (*)    AST 86 (*)  ALT 49 (*)    GFR calc non Af Amer 26 (*)    GFR calc Af Amer 30 (*)    All other components within normal limits  MAGNESIUM - Abnormal; Notable for the following components:   Magnesium 1.4 (*)    All other components within normal limits  CULTURE, BLOOD (ROUTINE X 2)  CULTURE, BLOOD (ROUTINE X 2)  URINE CULTURE  PREGNANCY, URINE  LACTIC ACID, PLASMA  PATHOLOGIST SMEAR REVIEW  I-STAT CG4 LACTIC ACID, ED    EKG None  Radiology Ct Renal Stone Study  Result Date: 09/17/2018 CLINICAL DATA:  Right flank pain since yesterday, urinary urgency, dysuria, no hx renal stones EXAM: CT ABDOMEN AND PELVIS WITHOUT CONTRAST TECHNIQUE: Multidetector CT imaging of the abdomen and pelvis was performed following the standard protocol without IV contrast. COMPARISON:  None. FINDINGS: Lower chest: Clear lung bases.  Heart normal in size. Hepatobiliary: Diffuse decreased attenuation of the liver consistent with fatty infiltration. Liver mildly enlarged. No liver mass or focal lesion. Gallbladder is unremarkable. No bile duct dilation Pancreas: Unremarkable. No pancreatic ductal dilatation or surrounding inflammatory changes. Spleen: Normal in size without focal abnormality. Adrenals/Urinary Tract: No adrenal masses. There is mild right hydronephrosis with more significant right perinephric stranding. Stranding extends to the proximal right ureter. Findings are due to a 3 mm stone at the right ureterovesicular junction. No left hydronephrosis or perinephric stranding. No intrarenal stones. No renal masses. Left ureter is normal course and in caliber. Bladder is unremarkable. Stomach/Bowel: Stomach is unremarkable. Small bowel and colon are normal in caliber. No wall thickening. No inflammation. Vascular/Lymphatic: No significant vascular  findings are present. No enlarged abdominal or pelvic lymph nodes. Reproductive: Well-positioned IUD. Uterus and adnexa otherwise unremarkable. Other: No abdominal wall hernia or abnormality. No abdominopelvic ascites. Musculoskeletal: No acute or significant osseous findings. IMPRESSION: 1. 3 mm stone at the right ureterovesicular junction leads to mild right hydroureteronephrosis with prominent right perinephric stranding. 2. No other acute abnormalities.  No intrarenal stones. 3. Mild hepatomegaly with diffuse hepatic steatosis. Electronically Signed   By: Lajean Manes M.D.   On: 09/17/2018 17:59    Procedures .Critical Care Performed by: Elnora Morrison, MD Authorized by: Elnora Morrison, MD   Critical care provider statement:    Critical care time (minutes):  40   Critical care start time:  09/17/2018 11:20 PM   Critical care end time:  09/18/2018 12:00 AM   Critical care time was exclusive of:  Separately billable procedures and treating other patients and teaching time   Critical care was necessary to treat or prevent imminent or life-threatening deterioration of the following conditions:  Sepsis   Critical care was time spent personally by me on the following activities:  Discussions with consultants, evaluation of patient's response to treatment, examination of patient, ordering and performing treatments and interventions, ordering and review of laboratory studies, ordering and review of radiographic studies, pulse oximetry, re-evaluation of patient's condition, obtaining history from patient or surrogate and review of old charts   (including critical care time)  Medications Ordered in ED Medications  sodium chloride 0.9 % bolus 1,000 mL (has no administration in time range)  acetaminophen (TYLENOL) tablet 650 mg (650 mg Oral Given 09/17/18 1619)  ibuprofen (ADVIL,MOTRIN) tablet 600 mg (600 mg Oral Given 09/17/18 1747)  cefTRIAXone (ROCEPHIN) 1 g in sodium chloride 0.9 % 100 mL IVPB (0 g  Intravenous Stopped 09/17/18 1951)  sodium chloride 0.9 % bolus 1,000 mL (0 mLs Intravenous Stopped 09/17/18  1952)  sodium chloride 0.9 % bolus 1,000 mL (0 mLs Intravenous Stopped 09/17/18 2311)  potassium chloride 10 mEq in 100 mL IVPB (0 mEq Intravenous Stopped 09/17/18 2312)     Initial Impression / Assessment and Plan / ED Course  I have reviewed the triage vital signs and the nursing notes.  Pertinent labs & imaging results that were available during my care of the patient were reviewed by me and considered in my medical decision making (see chart for details).    Patient presents with likely 2 separate concerns.  Patient has influenza-like symptoms and diagnosis yesterday on Tamiflu.  Ankle pain we discussed differential diagnosis.  Patient is hematuria without sign of significant infection we discussed risks and benefits of CT scan patient comfortable CT stone study.  Urine culture will be sent. Patient has fever and tachycardia, with kidney stone and fever Rocephin given and urine culture sent.  Blood cultures added afterward with acute renal failure and plan for transfer/admission to [facility].  Discussed with Dr. Diona Fanti who will see the patient tomorrow morning and discussed with hospitalist Dr. Hampton Abbot for admission.  Updated patient and repeat fluid bolus ordered. While waiting transfer patient's blood pressure did drop into the 14G systolic.  With clinical infection plan for sepsis screen added third liter bolus and repeat lactic acid.  Patient had received Rocephin. The patients results and plan were reviewed and discussed.   Any x-rays performed were independently reviewed by myself.   Differential diagnosis were considered with the presenting HPI.  Medications  sodium chloride 0.9 % bolus 1,000 mL (has no administration in time range)  acetaminophen (TYLENOL) tablet 650 mg (650 mg Oral Given 09/17/18 1619)  ibuprofen (ADVIL,MOTRIN) tablet 600 mg (600 mg Oral Given 09/17/18 1747)    cefTRIAXone (ROCEPHIN) 1 g in sodium chloride 0.9 % 100 mL IVPB (0 g Intravenous Stopped 09/17/18 1951)  sodium chloride 0.9 % bolus 1,000 mL (0 mLs Intravenous Stopped 09/17/18 1952)  sodium chloride 0.9 % bolus 1,000 mL (0 mLs Intravenous Stopped 09/17/18 2311)  potassium chloride 10 mEq in 100 mL IVPB (0 mEq Intravenous Stopped 09/17/18 2312)    Vitals:   09/17/18 1909 09/17/18 1952 09/17/18 2115 09/17/18 2338  BP: 109/65 (!) 102/57  (!) 87/60  Pulse: (!) 112 (!) 108 (!) 114 (!) 106  Resp: 18 20  18   Temp: (!) 100.8 F (38.2 C) 99.3 F (37.4 C)  98.6 F (37 C)  TempSrc:  Oral  Oral  SpO2: 100% 95% 100% 100%  Weight:      Height:        Final diagnoses:  Acute renal failure, unspecified acute renal failure type (HCC)  Kidney stone on right side  Influenza  Hypokalemia    Admission/ observation were discussed with the admitting physician, patient and/or family and they are comfortable with the plan.    Final Clinical Impressions(s) / ED Diagnoses   Final diagnoses:  Acute renal failure, unspecified acute renal failure type [facility])  Kidney stone on right side  Influenza  Hypokalemia    ED Discharge Orders    None       Elnora Morrison, MD 09/17/18 2142    Elnora Morrison, MD 09/18/18 0005

## 2018-09-18 ENCOUNTER — Inpatient Hospital Stay (HOSPITAL_COMMUNITY): Payer: BLUE CROSS/BLUE SHIELD | Admitting: Anesthesiology

## 2018-09-18 ENCOUNTER — Encounter (HOSPITAL_COMMUNITY): Payer: Self-pay | Admitting: Anesthesiology

## 2018-09-18 ENCOUNTER — Inpatient Hospital Stay (HOSPITAL_COMMUNITY): Payer: BLUE CROSS/BLUE SHIELD

## 2018-09-18 ENCOUNTER — Encounter (HOSPITAL_COMMUNITY)
Admission: EM | Disposition: A | Discharge status: Home / Self Care | Payer: Self-pay | Source: Home / Self Care | Attending: Internal Medicine

## 2018-09-18 DIAGNOSIS — N1330 Unspecified hydronephrosis: Secondary | ICD-10-CM | POA: Diagnosis not present

## 2018-09-18 DIAGNOSIS — I10 Essential (primary) hypertension: Secondary | ICD-10-CM | POA: Diagnosis not present

## 2018-09-18 DIAGNOSIS — N201 Calculus of ureter: Secondary | ICD-10-CM | POA: Diagnosis present

## 2018-09-18 DIAGNOSIS — N200 Calculus of kidney: Secondary | ICD-10-CM

## 2018-09-18 DIAGNOSIS — N12 Tubulo-interstitial nephritis, not specified as acute or chronic: Secondary | ICD-10-CM | POA: Diagnosis present

## 2018-09-18 HISTORY — PX: CYSTOSCOPY WITH RETROGRADE PYELOGRAM, URETEROSCOPY AND STENT PLACEMENT: SHX5789

## 2018-09-18 LAB — CBC WITH DIFFERENTIAL/PLATELET
Abs Immature Granulocytes: 0.21 10*3/uL — ABNORMAL HIGH (ref 0.00–0.07)
Basophils Absolute: 0 10*3/uL (ref 0.0–0.1)
Basophils Relative: 0 %
Eosinophils Absolute: 0 10*3/uL (ref 0.0–0.5)
Eosinophils Relative: 0 %
HCT: 42.3 % (ref 36.0–46.0)
Hemoglobin: 13.3 g/dL (ref 12.0–15.0)
Immature Granulocytes: 1 %
Lymphocytes Relative: 5 %
Lymphs Abs: 0.8 10*3/uL (ref 0.7–4.0)
MCH: 27 pg (ref 26.0–34.0)
MCHC: 31.4 g/dL (ref 30.0–36.0)
MCV: 86 fL (ref 80.0–100.0)
Monocytes Absolute: 0.4 10*3/uL (ref 0.1–1.0)
Monocytes Relative: 3 %
NEUTROS PCT: 91 %
Neutro Abs: 14.1 10*3/uL — ABNORMAL HIGH (ref 1.7–7.7)
Platelets: 115 10*3/uL — ABNORMAL LOW (ref 150–400)
RBC: 4.92 MIL/uL (ref 3.87–5.11)
RDW: 14.6 % (ref 11.5–15.5)
WBC: 15.5 10*3/uL — ABNORMAL HIGH (ref 4.0–10.5)
nRBC: 0 % (ref 0.0–0.2)

## 2018-09-18 LAB — SURGICAL PCR SCREEN
MRSA, PCR: NEGATIVE
Staphylococcus aureus: NEGATIVE

## 2018-09-18 LAB — BASIC METABOLIC PANEL
Anion gap: 12 (ref 5–15)
Anion gap: 10 (ref 5–15)
BUN: 26 mg/dL — ABNORMAL HIGH (ref 6–20)
BUN: 23 mg/dL — AB (ref 6–20)
CALCIUM: 7.3 mg/dL — AB (ref 8.9–10.3)
CO2: 18 mmol/L — ABNORMAL LOW (ref 22–32)
CO2: 20 mmol/L — ABNORMAL LOW (ref 22–32)
CREATININE: 2.02 mg/dL — AB (ref 0.44–1.00)
Calcium: 7.2 mg/dL — ABNORMAL LOW (ref 8.9–10.3)
Chloride: 105 mmol/L (ref 98–111)
Chloride: 106 mmol/L (ref 98–111)
Creatinine, Ser: 2.47 mg/dL — ABNORMAL HIGH (ref 0.44–1.00)
GFR calc Af Amer: 35 mL/min — ABNORMAL LOW (ref >60)
GFR calc non Af Amer: 30 mL/min — ABNORMAL LOW (ref >60)
GFR, EST AFRICAN AMERICAN: 27 mL/min — AB (ref >60)
GFR, EST NON AFRICAN AMERICAN: 23 mL/min — AB (ref >60)
Glucose, Bld: 102 mg/dL — ABNORMAL HIGH (ref 70–99)
Glucose, Bld: 114 mg/dL — ABNORMAL HIGH (ref 70–99)
Potassium: 2.9 mmol/L — ABNORMAL LOW (ref 3.5–5.1)
Potassium: 3.2 mmol/L — ABNORMAL LOW (ref 3.5–5.1)
Sodium: 135 mmol/L (ref 135–145)
Sodium: 136 mmol/L (ref 135–145)

## 2018-09-18 LAB — LACTIC ACID, PLASMA
Lactic Acid, Venous: 2.2 mmol/L (ref 0.5–1.9)
Lactic Acid, Venous: 2.5 mmol/L (ref 0.5–1.9)
Lactic Acid, Venous: 2.5 mmol/L (ref 0.5–1.9)

## 2018-09-18 LAB — POCT I-STAT 4, (NA,K, GLUC, HGB,HCT)
Glucose, Bld: 113 mg/dL — ABNORMAL HIGH (ref 70–99)
HCT: 38 % (ref 36.0–46.0)
Hemoglobin: 12.9 g/dL (ref 12.0–15.0)
Potassium: 3.7 mmol/L (ref 3.5–5.1)
Sodium: 140 mmol/L (ref 135–145)

## 2018-09-18 SURGERY — CYSTOURETEROSCOPY, WITH RETROGRADE PYELOGRAM AND STENT INSERTION
Anesthesia: General | Site: Ureter

## 2018-09-18 MED ORDER — ALBUTEROL SULFATE (2.5 MG/3ML) 0.083% IN NEBU
2.5000 mg | INHALATION_SOLUTION | Freq: Once | RESPIRATORY_TRACT | Status: AC
  Administered 2018-09-18: 2.5 mg via RESPIRATORY_TRACT

## 2018-09-18 MED ORDER — ACETAMINOPHEN 325 MG PO TABS
650.0000 mg | ORAL_TABLET | Freq: Once | ORAL | Status: AC | PRN
  Administered 2018-09-20: 650 mg via ORAL
  Filled 2018-09-18: qty 2

## 2018-09-18 MED ORDER — SODIUM CHLORIDE 0.9 % IR SOLN
Status: DC | PRN
  Administered 2018-09-18: 3000 mL

## 2018-09-18 MED ORDER — SODIUM CHLORIDE 0.9 % IV SOLN
INTRAVENOUS | Status: AC
  Administered 2018-09-18: 03:00:00 via INTRAVENOUS

## 2018-09-18 MED ORDER — PHENYLEPHRINE 40 MCG/ML (10ML) SYRINGE FOR IV PUSH (FOR BLOOD PRESSURE SUPPORT)
PREFILLED_SYRINGE | INTRAVENOUS | Status: AC
Start: 2018-09-18 — End: ?
  Filled 2018-09-18: qty 10

## 2018-09-18 MED ORDER — SODIUM CHLORIDE 0.9 % IV SOLN
INTRAVENOUS | Status: AC
  Filled 2018-09-18: qty 20

## 2018-09-18 MED ORDER — IPRATROPIUM-ALBUTEROL 0.5-2.5 (3) MG/3ML IN SOLN: 3.0000 mL | Freq: Four times a day (QID) | RESPIRATORY_TRACT | Status: DC | PRN

## 2018-09-18 MED ORDER — MIDAZOLAM HCL 5 MG/5ML IJ SOLN
INTRAMUSCULAR | Status: DC | PRN
  Administered 2018-09-18: 2 mg via INTRAVENOUS

## 2018-09-18 MED ORDER — LIDOCAINE HCL (CARDIAC) PF 50 MG/5ML IV SOSY
PREFILLED_SYRINGE | INTRAVENOUS | Status: DC | PRN
  Administered 2018-09-18: 75 mg via INTRAVENOUS

## 2018-09-18 MED ORDER — ALBUTEROL SULFATE (2.5 MG/3ML) 0.083% IN NEBU
INHALATION_SOLUTION | RESPIRATORY_TRACT | Status: AC
  Administered 2018-09-18: 2.5 mg
  Filled 2018-09-18: qty 3

## 2018-09-18 MED ORDER — SODIUM CHLORIDE 0.9 % IV SOLN: 1.0000 g | Freq: Once | INTRAVENOUS | Status: DC

## 2018-09-18 MED ORDER — ENOXAPARIN SODIUM 40 MG/0.4ML ~~LOC~~ SOLN
40.0000 mg | SUBCUTANEOUS | Status: DC
  Administered 2018-09-19 – 2018-09-21 (×3): 40 mg via SUBCUTANEOUS
  Filled 2018-09-18 (×4): qty 0.4

## 2018-09-18 MED ORDER — FENTANYL CITRATE (PF) 100 MCG/2ML IJ SOLN
INTRAMUSCULAR | Status: AC
  Filled 2018-09-18: qty 2

## 2018-09-18 MED ORDER — OXYBUTYNIN CHLORIDE 5 MG PO TABS
5.0000 mg | ORAL_TABLET | Freq: Three times a day (TID) | ORAL | Status: DC | PRN
  Filled 2018-09-18 (×2): qty 1

## 2018-09-18 MED ORDER — FENTANYL CITRATE (PF) 100 MCG/2ML IJ SOLN
INTRAMUSCULAR | Status: DC | PRN
  Administered 2018-09-18 (×4): 50 ug via INTRAVENOUS

## 2018-09-18 MED ORDER — HYDROMORPHONE HCL 1 MG/ML IJ SOLN: 0.2500 mg | INTRAMUSCULAR | Status: DC | PRN

## 2018-09-18 MED ORDER — ACETAMINOPHEN 10 MG/ML IV SOLN
INTRAVENOUS | Status: DC | PRN
  Administered 2018-09-18: 1000 mg via INTRAVENOUS

## 2018-09-18 MED ORDER — POTASSIUM CHLORIDE CRYS ER 20 MEQ PO TBCR
40.0000 meq | EXTENDED_RELEASE_TABLET | ORAL | Status: DC
  Administered 2018-09-18: 40 meq via ORAL
  Filled 2018-09-18: qty 2

## 2018-09-18 MED ORDER — SODIUM CHLORIDE 0.9 % IV SOLN
INTRAVENOUS | Status: DC | PRN
  Administered 2018-09-18: 8 mL

## 2018-09-18 MED ORDER — SODIUM CHLORIDE 0.9 % IV SOLN
INTRAVENOUS | Status: DC
  Administered 2018-09-18: 16:00:00 via INTRAVENOUS

## 2018-09-18 MED ORDER — ONDANSETRON HCL 4 MG/2ML IJ SOLN
INTRAMUSCULAR | Status: DC | PRN
  Administered 2018-09-18: 4 mg via INTRAVENOUS

## 2018-09-18 MED ORDER — PROPOFOL 10 MG/ML IV BOLUS
INTRAVENOUS | Status: DC | PRN
  Administered 2018-09-18: 275 mg via INTRAVENOUS

## 2018-09-18 MED ORDER — POTASSIUM CHLORIDE 10 MEQ/100ML IV SOLN
10.0000 meq | INTRAVENOUS | Status: DC
  Administered 2018-09-18: 10 meq via INTRAVENOUS
  Filled 2018-09-18: qty 100

## 2018-09-18 MED ORDER — SODIUM CHLORIDE 0.9 % IV SOLN
INTRAVENOUS | Status: DC | PRN
  Administered 2018-09-18 (×2): via INTRAVENOUS

## 2018-09-18 MED ORDER — MIDAZOLAM HCL 2 MG/2ML IJ SOLN
INTRAMUSCULAR | Status: AC
  Filled 2018-09-18: qty 2

## 2018-09-18 MED ORDER — PROPOFOL 10 MG/ML IV BOLUS
INTRAVENOUS | Status: AC
  Filled 2018-09-18: qty 20

## 2018-09-18 MED ORDER — MORPHINE SULFATE (PF) 2 MG/ML IV SOLN
1.0000 mg | INTRAVENOUS | Status: DC | PRN
  Administered 2018-09-18 – 2018-09-20 (×6): 1 mg via INTRAVENOUS
  Filled 2018-09-18 (×6): qty 1

## 2018-09-18 MED ORDER — SCOPOLAMINE 1 MG/3DAYS TD PT72
MEDICATED_PATCH | TRANSDERMAL | Status: DC | PRN
  Administered 2018-09-18: 1 via TRANSDERMAL

## 2018-09-18 MED ORDER — PROMETHAZINE HCL 25 MG/ML IJ SOLN: 6.2500 mg | INTRAMUSCULAR | Status: DC | PRN

## 2018-09-18 MED ORDER — SODIUM CHLORIDE 0.9 % IV BOLUS
500.0000 mL | Freq: Once | INTRAVENOUS | Status: AC
  Administered 2018-09-18: 500 mL via INTRAVENOUS

## 2018-09-18 MED ORDER — OSELTAMIVIR PHOSPHATE 30 MG PO CAPS
30.0000 mg | ORAL_CAPSULE | Freq: Two times a day (BID) | ORAL | Status: DC
  Administered 2018-09-18 – 2018-09-20 (×5): 30 mg via ORAL
  Filled 2018-09-18 (×5): qty 1

## 2018-09-18 MED ORDER — MIDAZOLAM HCL 2 MG/2ML IJ SOLN: 0.5000 mg | Freq: Once | INTRAMUSCULAR | Status: DC | PRN

## 2018-09-18 MED ORDER — SODIUM CHLORIDE 0.9 % IV SOLN
INTRAVENOUS | Status: AC
  Administered 2018-09-19: 01:00:00 via INTRAVENOUS

## 2018-09-18 MED ORDER — SCOPOLAMINE 1 MG/3DAYS TD PT72
MEDICATED_PATCH | TRANSDERMAL | Status: AC
  Filled 2018-09-18: qty 1

## 2018-09-18 MED ORDER — DEXAMETHASONE SODIUM PHOSPHATE 10 MG/ML IJ SOLN
INTRAMUSCULAR | Status: DC | PRN
  Administered 2018-09-18: 10 mg via INTRAVENOUS

## 2018-09-18 MED ORDER — POTASSIUM CHLORIDE 10 MEQ/100ML IV SOLN: 10.0000 meq | INTRAVENOUS | Status: AC

## 2018-09-18 MED ORDER — ACETAMINOPHEN 10 MG/ML IV SOLN
INTRAVENOUS | Status: AC
  Filled 2018-09-18: qty 100

## 2018-09-18 MED ORDER — MEPERIDINE HCL 50 MG/ML IJ SOLN: 6.2500 mg | INTRAMUSCULAR | Status: DC | PRN

## 2018-09-18 MED ORDER — SODIUM CHLORIDE 0.9 % IV SOLN
1.0000 g | INTRAVENOUS | Status: DC
  Administered 2018-09-18: 2 g via INTRAVENOUS
  Administered 2018-09-19 – 2018-09-20 (×2): 1 g via INTRAVENOUS
  Filled 2018-09-18 (×3): qty 1

## 2018-09-18 SURGICAL SUPPLY — 21 items
BAG URO CATCHER STRL LF (MISCELLANEOUS) ×2 IMPLANT
BASKET LASER NITINOL 1.9FR (BASKET) IMPLANT
BASKET ZERO TIP NITINOL 2.4FR (BASKET) IMPLANT
CATH INTERMIT  6FR 70CM (CATHETERS) ×2 IMPLANT
CLOTH BEACON ORANGE TIMEOUT ST (SAFETY) ×2 IMPLANT
COVER WAND RF STERILE (DRAPES) IMPLANT
FIBER LASER FLEXIVA 365 (UROLOGICAL SUPPLIES) IMPLANT
FIBER LASER TRAC TIP (UROLOGICAL SUPPLIES) IMPLANT
GLOVE BIOGEL M 8.0 STRL (GLOVE) ×2 IMPLANT
GOWN STRL REUS W/ TWL XL LVL3 (GOWN DISPOSABLE) IMPLANT
GOWN STRL REUS W/TWL LRG LVL3 (GOWN DISPOSABLE) ×4 IMPLANT
GOWN STRL REUS W/TWL XL LVL3 (GOWN DISPOSABLE)
GUIDEWIRE ANG ZIPWIRE 038X150 (WIRE) IMPLANT
GUIDEWIRE STR DUAL SENSOR (WIRE) ×2 IMPLANT
IV NS 1000ML (IV SOLUTION) ×1
IV NS 1000ML BAXH (IV SOLUTION) ×1 IMPLANT
MANIFOLD NEPTUNE II (INSTRUMENTS) ×2 IMPLANT
PACK CYSTO (CUSTOM PROCEDURE TRAY) ×2 IMPLANT
STENT URET 6FRX24 CONTOUR (STENTS) ×2 IMPLANT
TUBING CONNECTING 10 (TUBING) ×2 IMPLANT
TUBING UROLOGY SET (TUBING) ×2 IMPLANT

## 2018-09-18 NOTE — Anesthesia Postprocedure Evaluation (Signed)
Anesthesia Post Note  Patient: Julia Morris  Procedure(s) Performed: CYSTOSCOPY WITH RIGHT RETROGRADE PYELOGRAM,  AND RIGHT JJ STENT PLACEMENT (N/A Ureter)     Patient location during evaluation: PACU Anesthesia Type: General Level of consciousness: awake and alert, patient cooperative and oriented Pain management: pain level controlled Vital Signs Assessment: post-procedure vital signs reviewed and stable Respiratory status: spontaneous breathing, nonlabored ventilation and respiratory function stable Cardiovascular status: blood pressure returned to baseline and stable Postop Assessment: no apparent nausea or vomiting Anesthetic complications: no    Last Vitals:  Vitals:   09/18/18 1441 09/18/18 1512  BP: (!) 116/59 102/64  Pulse: (!) 118 (!) 113  Resp: 17 18  Temp: (!) 38.3 C 37.8 C  SpO2: 97% 97%    Last Pain:  Vitals:   09/18/18 1600  TempSrc:   PainSc: 0-No pain                 Sedale Jenifer,E. Asante Blanda

## 2018-09-18 NOTE — Anesthesia Procedure Notes (Addendum)
Procedure Name: LMA Insertion Date/Time: 09/18/2018 1:19 PM Performed by: Lissa Morales, CRNA Pre-anesthesia Checklist: Patient identified, Emergency Drugs available, Suction available and Patient being monitored Patient Re-evaluated:Patient Re-evaluated prior to induction Oxygen Delivery Method: Circle system utilized Preoxygenation: Pre-oxygenation with 100% oxygen Induction Type: IV induction Ventilation: Mask ventilation without difficulty LMA: LMA with gastric port inserted LMA Size: 4.0 Tube type: Oral (Oral sump down gastric port and  stomach decompressed) Number of attempts: 1 Airway Equipment and Method: Oral airway Placement Confirmation: positive ETCO2 Tube secured with: Tape Dental Injury: Teeth and Oropharynx as per pre-operative assessment  Comments: Mouth very small and hard to open

## 2018-09-18 NOTE — Progress Notes (Signed)
PROGRESS NOTE  HAREEM SUROWIEC [facility]:[id number] DOB: July 24, 1976 DOA: 09/17/2018 PCP: Debbrah Alar, NP   LOS: 1 day   Brief narrative: Patient is a 43 y.o. female with hx of HTN and recent diagnosis of influenza A on Tamiflu who presented to the ED on 09/17/2018 with progressive R flank plan, difficulty urinating, and worsening fevers/chills.  In the ED, she had fever of 102.8, mild tachycardia, and BP range 90-100/50s.  CT abd/pelvis non contrast showed R hydronephrosis with R perinephric stranding and 66mm stone at R UVJ.  UA showed large Hb but otherwise negative. Creatinine elevated to 2.28 from prior Cr 0.7.  Patient was admitted for acute right pyelonephritis, right UVJ stone, AKI and influenza A.  Subjective: Patient was seen and examined this morning.  Alert.  Not in distress.  No fever.  Continues to have mild right-sided flank pain.  Seen by urology today.  Noted a plan for OR this afternoon.  Assessment/Plan:  Active Problems:   Kidney stone  #Right sided pyelonephritis -although UA is negative, patient presents with fever and has perinephric stranding and CT scan.  Continue IV Rocephin.  Pending urine culture.  # R renal stone 22mm at R ureteropelvic junction with hydronephrosis -discussed with urologist.  Plan for ureteroscopy, stone removal and stenting this afternoon.  Continue pain control.  #Acute kidney injury -normal creatinine at baseline, elevated to 2.3 on admission.  Likely secondary to pyelonephritis and probably hydronephrosis.  Continue IV fluid.  Repeat blood work in the morning.  #Influenza A -diagnosed on 1/31 and started on Tamiflu by PCP.  Continue same.  Continue droplet precautions.  #Hypokalemia -potassium 2.9 this a.m.  Oral replacement ordered.  However, patient requested IV replacement.  Will replace 10 mEq x 4 IV.  #Morbid obesity - patient has been advised to make an attempt to improve diet and exercise patterns to aid in weight loss.  Mobility:  Encourage ambulation procedure Diet: N.p.o. for procedure.  Regular diet after that DVT prophylaxis:  Lovenox Code Status:   Code Status: Full Code  Family Communication:  Multiple family members at bedside. Updated Disposition Plan:  Home likely in 1 to 2 days.  Antibiotics: . IV Rocephin -2/1 -continue  Infusions: . sodium chloride 125 mL/hr at 09/18/18 0308  . [MAR Hold] cefTRIAXone (ROCEPHIN)  IV    . [MAR Hold] potassium chloride      Scheduled Meds: . [MAR Hold] enoxaparin (LOVENOX) injection  40 mg Subcutaneous Q24H  . [MAR Hold] oseltamivir  30 mg Oral BID    PRN meds: [MAR Hold] ipratropium-albuterol, [MAR Hold]  morphine injection   Consultants:  Urology  Procedures:  Ureteroscopy today  Objective: Vitals:   09/18/18 0555 09/18/18 1111  BP: (P) 100/68 114/68  Pulse: (!) (P) 105 (!) 118  Resp: (P) 16 16  Temp: (P) 99.2 F (37.3 C) 98.8 F (37.1 C)  SpO2: (P) 98% 98%    Intake/Output Summary (Last 24 hours) at 09/18/2018 1225 Last data filed at 09/18/2018 0144 Gross per 24 hour  Intake 3203.63 ml  Output -  Net 3203.63 ml   Filed Weights   09/17/18 1610  Weight: 111.5 kg   Body mass index is 42.19 kg/m.   Physical Exam: GENERAL: Pleasant, middle-aged, HENT: No scleral pallor or icterus. Pupils equally reactive to light. Oral mucosa is moist NECK: is supple, no palpable thyroid enlargement. CHEST: Clear to auscultation. No crackles or wheezes. Non tender on palpation. Diminished breath sounds bilaterally. CVS: S1 and S2 heard,  no murmur. Regular rate and rhythm. No pericardial rub. ABDOMEN: Soft, non-tender, bowel sounds are present. No palpable hepato-splenomegaly.  Mild tenderness in right flank, right costovertebral angle area. EXTREMITIES: No edema. CNS: Alert, awake, oriented x3 SKIN: warm and dry without rashes.  Data Review: I have personally reviewed the laboratory data and studies available.  CBC Latest Ref Rng & Units 09/18/2018  09/17/2018 09/01/2017  WBC 4.0 - 10.5 K/uL 15.5(H) 16.8(H) 5.3  Hemoglobin 12.0 - 15.0 g/dL 13.3 13.7 13.8  Hematocrit 36.0 - 46.0 % 42.3 43.1 42.3  Platelets 150 - 400 K/uL 115(L) 173 299.0   BMP Latest Ref Rng & Units 09/18/2018 09/17/2018 02/08/2018  Glucose 70 - 99 mg/dL 102(H) 93 84  BUN 6 - 20 mg/dL 26(H) 22(H) 17  Creatinine 0.44 - 1.00 mg/dL 2.47(H) 2.28(H) 0.72  Sodium 135 - 145 mmol/L 135 129(L) 137  Potassium 3.5 - 5.1 mmol/L 2.9(L) 2.9(L) 3.9  Chloride 98 - 111 mmol/L 105 95(L) 102  CO2 22 - 32 mmol/L 18(L) 20(L) 25  Calcium 8.9 - 10.3 mg/dL 7.2(L) 8.5(L) 9.4    Terrilee Croak, MD  [facility] 09/18/2018

## 2018-09-18 NOTE — H&P (Signed)
History and Physical  Julia Morris TOI:[id number] DOB: 03/28/1976 DOA: 09/17/2018 1658  Referring physician: Rosalyn Gess [facility]) PCP: Debbrah Alar, NP  Outpatient Specialists: n/a  HISTORY   Chief Complaint:   HPI: Julia Morris is a 43 y.o. female with HTN and hx of asthma who was recently diagnosed with influenza presented to [facility] ED with acute onset R flank pain and difficulty urinating. Patient has had generalized malaise, dry cough since Thursday ~ 2-3 days prior to presentation and was influenza A positive at PCP. Started on tamiflu the day prior. However, since yesterday, patient noted increasing urinary frequency but decreased output as well as acute right sided lower flank pain. Also having acute nausea and NBNB emesis prior to arrival. Denies history of kidney stones. CT abd/pelvis non contrast showed R hydronephrosis with R perinephric stranding and 94mm stone at R UVJ. Her UA shows large Hb but otherwise negative. Creatinine elevated to 2.28 from prior Cr 0.7.  Patient transferred from [facility] and admitted to [place] for further supportive tx and management of obstructive R kidney stone.   Review of Systems:  +subjective fevers/chills + dry cough intermittent  + acute R flank pain; increased frequency and difficulty urinating - no chest pain, dyspnea on exertion - no edema, PND, orthopnea - no nausea/vomiting; no tarry, melanotic or bloody stools - no weight changes  Rest of systems reviewed are negative, except as per above history.   ED course:  Vitals Blood pressure 95/68, pulse (!) 105, temperature 99.2 F (37.3 C), temperature source Oral, resp. rate 18, height 5\' 4"  (1.626 m), weight 111.5 kg, SpO2 99 %. Received IV potassium 10 mEq; NS bolus x 3L; ceftriaxone 1gm x 1; ibuprofen 600mg  x 1; tylenol 650mg  x 1  Past Medical History:  Diagnosis Date  . Anemia    ? history of anemia  . Hypertension    Past Surgical History:  Procedure Laterality Date  .  APPENDECTOMY  2003  . LEG SURGERY     femoral rod placement s/p fracture 2005 left leg  . TOE SURGERY  2006   right great toe.   . TONSILLECTOMY  1980s    Social History:  reports that she has never smoked. She has never used smokeless tobacco. She reports current alcohol use. She reports that she does not use drugs.  Allergies  Allergen Reactions  . Prednisone Swelling    Family History  Problem Relation Age of Onset  . Hypertension Mother   . Hyperlipidemia Mother   . Stroke Mother   . Hypertension Father   . Hyperlipidemia Father   . Diabetes Maternal Aunt   . Hypertension Maternal Aunt   . Hyperlipidemia Maternal Aunt   . Diabetes Maternal Uncle   . Hypertension Maternal Uncle   . Hyperlipidemia Maternal Uncle   . Diabetes Paternal Aunt   . Hypertension Paternal Aunt   . Hyperlipidemia Paternal Aunt   . Diabetes Paternal Uncle   . Hypertension Paternal Uncle   . Hyperlipidemia Paternal Uncle   . Diabetes Maternal Grandmother   . Hypertension Maternal Grandmother   . Hyperlipidemia Maternal Grandmother   . Diabetes Maternal Grandfather   . Hypertension Maternal Grandfather   . Hyperlipidemia Maternal Grandfather   . Diabetes Paternal Grandmother   . Hypertension Paternal Grandmother   . Hyperlipidemia Paternal Grandmother   . Heart attack Neg Hx   . Sudden death Neg Hx       Prior to Admission medications   Medication  Sig Start Date End Date Taking? Authorizing Provider  benzonatate (TESSALON) 100 MG capsule Take 1 capsule (100 mg total) by mouth 3 (three) times daily as needed. 09/16/18   Saguier, Percell Miller, PA-C  hydrochlorothiazide (HYDRODIURIL) 25 MG tablet Take 1 tablet (25 mg total) by mouth daily. 01/31/18   Debbrah Alar, NP  levonorgestrel (MIRENA) 20 MCG/24HR IUD 1 each by Intrauterine route once.    [provider]  oseltamivir (TAMIFLU) 75 MG capsule Take 1 capsule (75 mg total) by mouth 2 (two) times daily. 09/16/18   Saguier, Percell Miller, PA-C      PHYSICAL EXAM   Temp:  [98.6 F (37 C)-102.8 F (39.3 C)] 99.2 F (37.3 C) (02/02 0245) Pulse Rate:  [105-124] 105 (02/02 0245) Resp:  [18-20] 18 (02/02 0245) BP: (87-126)/(55-72) 95/68 (02/02 0245) SpO2:  [95 %-100 %] 99 % (02/02 0245) Weight:  [111.5 kg] 111.5 kg (02/01 1610)  BP 95/68 (BP Location: Right Arm)   Pulse (!) 105   Temp 99.2 F (37.3 C) (Oral)   Resp 18   Ht 5\' 4"  (1.626 m)   Wt 111.5 kg   SpO2 99%   BMI 42.19 kg/m    GEN obese young african-american female; resting in bed, mildly uncomfortable  HEENT NCAT EOM intact PERRL; clear oropharynx, no cervical LAD; dry mucus membranes  JVP estimated 5 cm H2O above RA; no HJR ; no carotid bruits b/l ;  CV regular tachycardic; normal S1 and S2; no m/r/g or S3/S4; PMI non displaced; no parasternal heave  RESP CTA b/l; breathing unlabored and symmetric  ABD soft NT ND +normoactive BS; +R flank tenderness  EXT warm throughout b/l; no peripheral edema b/l  PULSES  DP and radials 2+ intact b/l  SKIN/MSK no rashes or lesions  NEURO/PSYCH AAOx4; no focal deficits   DATA   LABS ON ADMISSION:  Basic Metabolic Panel: Recent Labs  Lab 09/17/18 1721  NA 129*  K 2.9*  CL 95*  CO2 20*  GLUCOSE 93  BUN 22*  CREATININE 2.28*  CALCIUM 8.5*  MG 1.4*   CBC: Recent Labs  Lab 09/17/18 1721  WBC 16.8*  NEUTROABS 15.9*  HGB 13.7  HCT 43.1  MCV 84.7  PLT 173   Liver Function Tests: Recent Labs  Lab 09/17/18 1721  AST 86*  ALT 49*  ALKPHOS 62  BILITOT 0.9  PROT 8.2*  ALBUMIN 3.8   No results for input(s): LIPASE, AMYLASE in the last 168 hours. No results for input(s): AMMONIA in the last 168 hours. Coagulation:  No results found for: INR, PROTIME No results found for: PTT Lactic Acid, Venous:     Component Value Date/Time   LATICACIDVEN 2.2 (HH) 09/18/2018 0145   Cardiac Enzymes: No results for input(s): CKTOTAL, CKMB, CKMBINDEX, TROPONINI in the last 168 hours. Urinalysis:    Component Value  Date/Time   COLORURINE YELLOW 09/17/2018 1618   APPEARANCEUR CLEAR 09/17/2018 1618   LABSPEC >1.030 (H) 09/17/2018 1618   PHURINE 5.5 09/17/2018 1618   GLUCOSEU NEGATIVE 09/17/2018 1618   GLUCOSEU NEGATIVE 09/01/2017 0943   HGBUR LARGE (A) 09/17/2018 1618   BILIRUBINUR SMALL (A) 09/17/2018 1618   KETONESUR NEGATIVE 09/17/2018 1618   PROTEINUR 100 (A) 09/17/2018 1618   UROBILINOGEN 0.2 09/01/2017 0943   NITRITE NEGATIVE 09/17/2018 1618   LEUKOCYTESUR NEGATIVE 09/17/2018 1618    BNP (last 3 results) No results for input(s): PROBNP in the last 8760 hours. CBG: No results for input(s): GLUCAP in the last 168 hours.  Radiological Exams on Admission: Ct Renal Stone Study  Result Date: 09/17/2018 CLINICAL DATA:  Right flank pain since yesterday, urinary urgency, dysuria, no hx renal stones EXAM: CT ABDOMEN AND PELVIS WITHOUT CONTRAST TECHNIQUE: Multidetector CT imaging of the abdomen and pelvis was performed following the standard protocol without IV contrast. COMPARISON:  None. FINDINGS: Lower chest: Clear lung bases.  Heart normal in size. Hepatobiliary: Diffuse decreased attenuation of the liver consistent with fatty infiltration. Liver mildly enlarged. No liver mass or focal lesion. Gallbladder is unremarkable. No bile duct dilation Pancreas: Unremarkable. No pancreatic ductal dilatation or surrounding inflammatory changes. Spleen: Normal in size without focal abnormality. Adrenals/Urinary Tract: No adrenal masses. There is mild right hydronephrosis with more significant right perinephric stranding. Stranding extends to the proximal right ureter. Findings are due to a 3 mm stone at the right ureterovesicular junction. No left hydronephrosis or perinephric stranding. No intrarenal stones. No renal masses. Left ureter is normal course and in caliber. Bladder is unremarkable. Stomach/Bowel: Stomach is unremarkable. Small bowel and colon are normal in caliber. No wall thickening. No inflammation.  Vascular/Lymphatic: No significant vascular findings are present. No enlarged abdominal or pelvic lymph nodes. Reproductive: Well-positioned IUD. Uterus and adnexa otherwise unremarkable. Other: No abdominal wall hernia or abnormality. No abdominopelvic ascites. Musculoskeletal: No acute or significant osseous findings. IMPRESSION: 1. 3 mm stone at the right ureterovesicular junction leads to mild right hydroureteronephrosis with prominent right perinephric stranding. 2. No other acute abnormalities.  No intrarenal stones. 3. Mild hepatomegaly with diffuse hepatic steatosis. Electronically Signed   By: Lajean Manes M.D.   On: 09/17/2018 17:59    EKG:  No EKG on admission; prior EKG from 09/01/17 shows NSR, low voltage, non specific ST changes in lateral leads  I have reviewed the patient's previous electronic chart records, labs, and other data.   ASSESSMENT AND PLAN   Assessment: SAHAANA WEITMAN is a 43 y.o. female witih hx of HTN and asthma who presents with acute R flank pain, fever, hypotension in setting of recent flu (influenza A positive) and newly discovered R renal UPJ stone. Given largely bland UA, suspect that hypotension is more secondary to ongoing flu infection and possible dehydration rather than superimposed pyelonephritis. Will still treat empirically with ceftriaxone. Initial CT abd/pelvis concerning for mild hydronephrosis and Cr is 2.2 markedly elevated from prior baseline 0.7. Will continue supportive tx + abx, contact urology, and monitor labs for improvement in renal function with hydration.   Active Problems:   Kidney stone   Plan:    # R renal stone 39mm at R ureteropelvic junction with hydronephrosis and AKI - continue IV fluids  - pain control with IV morphine 1mg  q4 prn severe pain - avoid nephrotoxic agents - if BP tolerate, trial flomax in AM - discuss with urology in AM - continue ceftriaxone 1gm q24h for at least 24-48 hour coverage (discontinue if cultures  negative) - blood cultures pending - tylenol prn fever - repeat lactate pending  # Influenza A (swab positive) - continue tamiflu (renal dose) BID - duonebs prn wheezing - droplet precautions  # AKI likely obstructive and pre-renal > Cr 2.3 (baseline 0.6-0.7) - continue fluids as above - repeat BMP daily - if Cr does not improve, consider renal ultrasound and possible urology intervention - avoid nephrotoxic agents including nsaids  # Hypokalemia 2.9  - s/p repletion in ED with 10 mEq  - pending repeat BMP in AM  - caution given decreased renal function  DVT Prophylaxis:  lovenox Code Status:  Full Code Family Communication: patient and aunt at bedside  Disposition Plan: admit to [facility] floor for sx improvement, AKI monitoring   Patient contact: Extended Emergency Contact Information Primary Emergency Contact: Amstutz,Betty Address: [address]          [place], [place] [address] [place] Phone: [phone] Relation: Mother  Time spent: > 35 mins  Colbert Ewing, MD [facility] Pager [phone]  If 7PM-7AM, please contact night-coverage [email or web address] Password [email or web address] 09/18/2018, 4:19 AM

## 2018-09-18 NOTE — Op Note (Signed)
Preoperative diagnosis: Right distal ureteral stone with pain/hydronephrosis/sepsis  Postoperative diagnosis: Possible passed right distal ureteral stone with hydronephrosis/sepsis  Principal procedure: Cystoscopy, right retrograde ureteropyelogram, placement of 6 French by 24 cm contour double-J stent without tether  Surgeon: Jacari Iannello  Anesthesia: General  Complications: None  Specimen: None  Drains: 6 French by 24 cm contour double-J stent without tether  Indications: 43 year old female admitted last night with new onset/newly diagnosed right distal ureteral stone, 3 mm in size.  Additionally, she has had the flu recently.  She has an elevating trend to her lactic acid, possibly consistent with sepsis.  There is no evidence, on urinalysis, of urinary tract infection.  However, with her increasing lactic acid and her distal ureteral stone, I have recommended we proceed with cystoscopy and stent placement to drain the right renal unit for possible infection with hydronephrosis/stone.  I described the procedure to the patient and her family.  They understand and desire to proceed.  Description of procedure: The patient was properly identified in the holding area.  Her right side was marked.  She was taken to the operating room where general anesthetic was administered.  She was placed in the dorsolithotomy position.  Genitalia and perineum were prepped and draped.  Proper timeout was performed.  20 French panendoscope was advanced into her bladder.  Systematic inspection revealed normal urothelium and anatomic landmarks.  The right ureteral orifice was slightly erythematous consistent with possible recently passed stone (no stone was recovered on screening the patient's urine).  Using a 6 [place] open-ended catheter, retrograde ureteropyelogram was performed.  This revealed no evidence of filling defect in the distal ureter.  There was minimal hydroureteronephrosis.  I did not feel like I should  try to basket for a stone, as the stone most likely has passed.  However, because of her mildly dilated ureter and her septic picture, I felt that putting a stent in would be judicious.  I negotiated a guidewire through the open-ended catheter, until it was seen in the upper pole calyceal system.  The open-ended catheter was removed and then I deployed a 6 [place] by 24 cm contour double-J stent in the right ureter with excellent proximal and distal curl seen using fluoroscopy and cystoscopy, respectively.  The tether was removed.  The bladder was drained.  At this point, the procedure was terminated.  The patient was then awakened and taken to the PACU.  She tolerated procedure well.

## 2018-09-18 NOTE — Progress Notes (Signed)
CRITICAL VALUE ALERT  Critical Value:  Lactic acid 2.5  Date & Time Notied:  09/18/18, @ 1245  Provider Notified: Jeannett Senior ,MD  Orders Received/Actions taken: YES

## 2018-09-18 NOTE — ED Notes (Signed)
[facility] at bedside 

## 2018-09-18 NOTE — ED Notes (Signed)
Lab called and states lactic is 2.2. Primary nurse aware and she will send message to admitting provider.

## 2018-09-18 NOTE — Anesthesia Preprocedure Evaluation (Addendum)
Anesthesia Evaluation  Patient identified by MRN, date of birth, ID band Patient awake    Reviewed: Allergy & Precautions, NPO status , Patient's Chart, lab work & pertinent test results  History of Anesthesia Complications Negative for: history of anesthetic complications  Airway Mallampati: III  TM Distance: >3 FB Neck ROM: Full    Dental  (+) Dental Advisory Given   Pulmonary sleep apnea and Continuous Positive Airway Pressure Ventilation , Recent URI  (flu), Residual Cough,    breath sounds clear to auscultation       Cardiovascular hypertension, Pt. on medications (-) angina Rhythm:Regular Rate:Normal     Neuro/Psych  Headaches,    GI/Hepatic negative GI ROS, Neg liver ROS,   Endo/Other  Morbid obesity  Renal/GU Renal InsufficiencyRenal disease (creat 2.47, K+ 3.7 in holding room)stones     Musculoskeletal   Abdominal   Peds  Hematology plt 115k   Anesthesia Other Findings   Reproductive/Obstetrics                           Anesthesia Physical Anesthesia Plan  ASA: III  Anesthesia Plan: General   Post-op Pain Management:    Induction: Intravenous  PONV Risk Score and Plan: 4 or greater and Scopolamine patch - Pre-op, Dexamethasone and Ondansetron  Airway Management Planned: LMA  Additional Equipment:   Intra-op Plan:   Post-operative Plan:   Informed Consent: I have reviewed the patients History and Physical, chart, labs and discussed the procedure including the risks, benefits and alternatives for the proposed anesthesia with the patient or authorized representative who has indicated his/her understanding and acceptance.     Dental advisory given  Plan Discussed with: CRNA and Surgeon  Anesthesia Plan Comments: (Plan routine monitors, GA- LMA OK)        Anesthesia Quick Evaluation

## 2018-09-18 NOTE — ED Notes (Signed)
Date and time results received: 09/18/18 2:17 AM (use smartphrase ".now" to insert current time)  Test: Lactic Acid Critical Value: 2.2  Name of Provider Notified: Norman Herrlich MD via text page  Orders Received? Or Actions Taken?: awaiting orders

## 2018-09-18 NOTE — Consult Note (Signed)
Urology Consult   Physician requesting consult: Terrilee Croak, MD  Reason for consult: Kidney stone  History of Present Illness: Julia Morris is a 43 y.o. female admitted to [facility] long hospital last night with probable flu of several days duration as well as new onset of right distal ureteral stone.  The patient began having pain approximately 48 hours ago.  This was associated with nausea and vomiting.  She has no prior history of kidney stones.  Urinalysis was not indicative of infection, but she is admitted for pain management as well as hydration for acute kidney injury, with creatinine being over 2.  She denies recent urinary tract infections.  She does have lower urinary tract symptoms including frequency and urgency.  She has had fever to approximately 102.  Her lactic acid has increased over the past 12 hours to 2.5.  Urologic consultation is requested for further management.   Past Medical History:  Diagnosis Date  . Anemia    ? history of anemia  . Hypertension     Past Surgical History:  Procedure Laterality Date  . APPENDECTOMY  2003  . LEG SURGERY     femoral rod placement s/p fracture 2005 left leg  . TOE SURGERY  2006   right great toe.   . TONSILLECTOMY  1980s     Current Hospital Medications: Scheduled Meds: . enoxaparin (LOVENOX) injection  40 mg Subcutaneous Q24H  . oseltamivir  30 mg Oral BID  . potassium chloride  40 mEq Oral Q2H   Continuous Infusions: . sodium chloride 125 mL/hr at 09/18/18 0308  . cefTRIAXone (ROCEPHIN)  IV     PRN Meds:.ipratropium-albuterol, morphine injection  Allergies:  Allergies  Allergen Reactions  . Prednisone Swelling    Family History  Problem Relation Age of Onset  . Hypertension Mother   . Hyperlipidemia Mother   . Stroke Mother   . Hypertension Father   . Hyperlipidemia Father   . Diabetes Maternal Aunt   . Hypertension Maternal Aunt   . Hyperlipidemia Maternal Aunt   . Diabetes Maternal Uncle   .  Hypertension Maternal Uncle   . Hyperlipidemia Maternal Uncle   . Diabetes Paternal Aunt   . Hypertension Paternal Aunt   . Hyperlipidemia Paternal Aunt   . Diabetes Paternal Uncle   . Hypertension Paternal Uncle   . Hyperlipidemia Paternal Uncle   . Diabetes Maternal Grandmother   . Hypertension Maternal Grandmother   . Hyperlipidemia Maternal Grandmother   . Diabetes Maternal Grandfather   . Hypertension Maternal Grandfather   . Hyperlipidemia Maternal Grandfather   . Diabetes Paternal Grandmother   . Hypertension Paternal Grandmother   . Hyperlipidemia Paternal Grandmother   . Heart attack Neg Hx   . Sudden death Neg Hx     Social History:  reports that she has never smoked. She has never used smokeless tobacco. She reports current alcohol use. She reports that she does not use drugs.  ROS: A complete review of systems was performed.  All systems are negative except for pertinent findings as noted.  Physical Exam:  Vital signs in last 24 hours: Temp:  [98.6 F (37 C)-102.8 F (39.3 C)] (P) 99.2 F (37.3 C) (02/02 0555) Pulse Rate:  [105-124] (P) 105 (02/02 0555) Resp:  [16-20] (P) 16 (02/02 0555) BP: (87-126)/(55-72) (P) 100/68 (02/02 0555) SpO2:  [95 %-100 %] (P) 98 % (02/02 0555) Weight:  [111.5 kg] 111.5 kg (02/01 1610) General:  Alert and oriented, No acute distress HEENT: Normocephalic,  atraumatic Neck: No JVD or lymphadenopathy Cardiovascular: Regular rate and rhythm Lungs: Normal inspiratory and expiratory excursion Abdomen: Obese, right lower quadrant tenderness. Extremities: No edema Neurologic: Grossly intact  Laboratory Data:  Recent Labs    09/17/18 1721 09/18/18 0501  WBC 16.8* 15.5*  HGB 13.7 13.3  HCT 43.1 42.3  PLT 173 115*    Recent Labs    09/17/18 1721 09/18/18 0501  NA 129* 135  K 2.9* 2.9*  CL 95* 105  GLUCOSE 93 102*  BUN 22* 26*  CALCIUM 8.5* 7.2*  CREATININE 2.28* 2.47*     Results for orders placed or performed during  the hospital encounter of 09/17/18 (from the past 24 hour(s))  Urinalysis, Routine w reflex microscopic- may I&O cath if menses     Status: Abnormal   Collection Time: 09/17/18  4:18 PM  Result Value Ref Range   Color, Urine YELLOW YELLOW   APPearance CLEAR CLEAR   Specific Gravity, Urine >1.030 (H) 1.005 - 1.030   pH 5.5 5.0 - 8.0   Glucose, UA NEGATIVE NEGATIVE mg/dL   Hgb urine dipstick LARGE (A) NEGATIVE   Bilirubin Urine SMALL (A) NEGATIVE   Ketones, ur NEGATIVE NEGATIVE mg/dL   Protein, ur 100 (A) NEGATIVE mg/dL   Nitrite NEGATIVE NEGATIVE   Leukocytes, UA NEGATIVE NEGATIVE  Pregnancy, urine     Status: None   Collection Time: 09/17/18  4:18 PM  Result Value Ref Range   Preg Test, Ur NEGATIVE NEGATIVE  Urinalysis, Microscopic (reflex)     Status: Abnormal   Collection Time: 09/17/18  4:18 PM  Result Value Ref Range   RBC / HPF 11-20 0 - 5 RBC/hpf   WBC, UA NONE SEEN 0 - 5 WBC/hpf   Bacteria, UA RARE (A) NONE SEEN   Squamous Epithelial / LPF 0-5 0 - 5  CBC with Differential     Status: Abnormal   Collection Time: 09/17/18  5:21 PM  Result Value Ref Range   WBC 16.8 (H) 4.0 - 10.5 K/uL   RBC 5.09 3.87 - 5.11 MIL/uL   Hemoglobin 13.7 12.0 - 15.0 g/dL   HCT 43.1 36.0 - 46.0 %   MCV 84.7 80.0 - 100.0 fL   MCH 26.9 26.0 - 34.0 pg   MCHC 31.8 30.0 - 36.0 g/dL   RDW 14.3 11.5 - 15.5 %   Platelets 173 150 - 400 K/uL   nRBC 0.0 0.0 - 0.2 %   Neutrophils Relative % 95 %   Neutro Abs 15.9 (H) 1.7 - 7.7 K/uL   Lymphocytes Relative 3 %   Lymphs Abs 0.6 (L) 0.7 - 4.0 K/uL   Monocytes Relative 1 %   Monocytes Absolute 0.2 0.1 - 1.0 K/uL   Eosinophils Relative 0 %   Eosinophils Absolute 0.0 0.0 - 0.5 K/uL   Basophils Relative 0 %   Basophils Absolute 0.0 0.0 - 0.1 K/uL   RBC Morphology MORPHOLOGY UNREMARKABLE    Smear Review MORPHOLOGY UNREMARKABLE    Immature Granulocytes 1 %   Abs Immature Granulocytes 0.10 (H) 0.00 - 0.07 K/uL  Comprehensive metabolic panel     Status:  Abnormal   Collection Time: 09/17/18  5:21 PM  Result Value Ref Range   Sodium 129 (L) 135 - 145 mmol/L   Potassium 2.9 (L) 3.5 - 5.1 mmol/L   Chloride 95 (L) 98 - 111 mmol/L   CO2 20 (L) 22 - 32 mmol/L   Glucose, Bld 93 70 - 99 mg/dL  BUN 22 (H) 6 - 20 mg/dL   Creatinine, Ser 2.28 (H) 0.44 - 1.00 mg/dL   Calcium 8.5 (L) 8.9 - 10.3 mg/dL   Total Protein 8.2 (H) 6.5 - 8.1 g/dL   Albumin 3.8 3.5 - 5.0 g/dL   AST 86 (H) 15 - 41 U/L   ALT 49 (H) 0 - 44 U/L   Alkaline Phosphatase 62 38 - 126 U/L   Total Bilirubin 0.9 0.3 - 1.2 mg/dL   GFR calc non Af Amer 26 (L) >60 mL/min   GFR calc Af Amer 30 (L) >60 mL/min   Anion gap 14 5 - 15  Magnesium     Status: Abnormal   Collection Time: 09/17/18  5:21 PM  Result Value Ref Range   Magnesium 1.4 (L) 1.7 - 2.4 mg/dL  Lactic acid, plasma     Status: None   Collection Time: 09/17/18  9:02 PM  Result Value Ref Range   Lactic Acid, Venous 1.8 0.5 - 1.9 mmol/L  Lactic acid, plasma     Status: Abnormal   Collection Time: 09/18/18  1:45 AM  Result Value Ref Range   Lactic Acid, Venous 2.2 (HH) 0.5 - 1.9 mmol/L  CBC with Differential/Platelet     Status: Abnormal   Collection Time: 09/18/18  5:01 AM  Result Value Ref Range   WBC 15.5 (H) 4.0 - 10.5 K/uL   RBC 4.92 3.87 - 5.11 MIL/uL   Hemoglobin 13.3 12.0 - 15.0 g/dL   HCT 42.3 36.0 - 46.0 %   MCV 86.0 80.0 - 100.0 fL   MCH 27.0 26.0 - 34.0 pg   MCHC 31.4 30.0 - 36.0 g/dL   RDW 14.6 11.5 - 15.5 %   Platelets 115 (L) 150 - 400 K/uL   nRBC 0.0 0.0 - 0.2 %   Neutrophils Relative % 91 %   Neutro Abs 14.1 (H) 1.7 - 7.7 K/uL   Lymphocytes Relative 5 %   Lymphs Abs 0.8 0.7 - 4.0 K/uL   Monocytes Relative 3 %   Monocytes Absolute 0.4 0.1 - 1.0 K/uL   Eosinophils Relative 0 %   Eosinophils Absolute 0.0 0.0 - 0.5 K/uL   Basophils Relative 0 %   Basophils Absolute 0.0 0.0 - 0.1 K/uL   WBC Morphology DOHLE BODIES    Immature Granulocytes 1 %   Abs Immature Granulocytes 0.21 (H) 0.00 - 0.07  K/uL  Basic metabolic panel     Status: Abnormal   Collection Time: 09/18/18  5:01 AM  Result Value Ref Range   Sodium 135 135 - 145 mmol/L   Potassium 2.9 (L) 3.5 - 5.1 mmol/L   Chloride 105 98 - 111 mmol/L   CO2 18 (L) 22 - 32 mmol/L   Glucose, Bld 102 (H) 70 - 99 mg/dL   BUN 26 (H) 6 - 20 mg/dL   Creatinine, Ser 2.47 (H) 0.44 - 1.00 mg/dL   Calcium 7.2 (L) 8.9 - 10.3 mg/dL   GFR calc non Af Amer 23 (L) >60 mL/min   GFR calc Af Amer 27 (L) >60 mL/min   Anion gap 12 5 - 15  Lactic acid, plasma     Status: Abnormal   Collection Time: 09/18/18  5:01 AM  Result Value Ref Range   Lactic Acid, Venous 2.5 (HH) 0.5 - 1.9 mmol/L   No results found for this or any previous visit (from the past 240 hour(s)).  Renal Function: Recent Labs    09/17/18 1721 09/18/18 0501  CREATININE 2.28* 2.47*   Estimated Creatinine Clearance: 36.6 mL/min (A) (by C-G formula based on SCr of 2.47 mg/dL (H)).  Radiologic Imaging: Ct Renal Stone Study  Result Date: 09/17/2018 CLINICAL DATA:  Right flank pain since yesterday, urinary urgency, dysuria, no hx renal stones EXAM: CT ABDOMEN AND PELVIS WITHOUT CONTRAST TECHNIQUE: Multidetector CT imaging of the abdomen and pelvis was performed following the standard protocol without IV contrast. COMPARISON:  None. FINDINGS: Lower chest: Clear lung bases.  Heart normal in size. Hepatobiliary: Diffuse decreased attenuation of the liver consistent with fatty infiltration. Liver mildly enlarged. No liver mass or focal lesion. Gallbladder is unremarkable. No bile duct dilation Pancreas: Unremarkable. No pancreatic ductal dilatation or surrounding inflammatory changes. Spleen: Normal in size without focal abnormality. Adrenals/Urinary Tract: No adrenal masses. There is mild right hydronephrosis with more significant right perinephric stranding. Stranding extends to the proximal right ureter. Findings are due to a 3 mm stone at the right ureterovesicular junction. No left  hydronephrosis or perinephric stranding. No intrarenal stones. No renal masses. Left ureter is normal course and in caliber. Bladder is unremarkable. Stomach/Bowel: Stomach is unremarkable. Small bowel and colon are normal in caliber. No wall thickening. No inflammation. Vascular/Lymphatic: No significant vascular findings are present. No enlarged abdominal or pelvic lymph nodes. Reproductive: Well-positioned IUD. Uterus and adnexa otherwise unremarkable. Other: No abdominal wall hernia or abnormality. No abdominopelvic ascites. Musculoskeletal: No acute or significant osseous findings. IMPRESSION: 1. 3 mm stone at the right ureterovesicular junction leads to mild right hydroureteronephrosis with prominent right perinephric stranding. 2. No other acute abnormalities.  No intrarenal stones. 3. Mild hepatomegaly with diffuse hepatic steatosis. Electronically Signed   By: Lajean Manes M.D.   On: 09/17/2018 17:59    I independently reviewed the above imaging studies.  Impression/Assessment:  3 mm right distal ureteral stone without obvious evidence of urinary tract infection, but increasing lactic acid consistent with sepsis.  Plan:  I would suggest that we proceed with eventual cystoscopy and stent placement (stone is at UVJ so extraction may be included) on an urgent basis.  The patient was eating this morning, I have made her n.p.o.  I will look at having her put on the schedule later on today for this procedure.

## 2018-09-18 NOTE — ED Notes (Signed)
Pt and family updated regarding delays. [facility] to arrive in 15 minutes.

## 2018-09-18 NOTE — Progress Notes (Signed)
PHARMACY NOTE -  ANTIBIOTIC RENAL DOSE ADJUSTMENT   Pharmacy to assist with antibiotic renal dose adjustment.  Patient has been initiated on oseltamivir 75mg  po bid for flu. SCr 2.28, estimated CrCl 39.7 ml/min Therefore, oseltamivir is changed to 30mg  po bid, per renal dosing guidelines.  Current dosage is appropriate and need for further dosage adjustment appears unlikely at present. Will sign off at this time.  Please reconsult if a change in clinical status warrants re-evaluation of dosage.

## 2018-09-18 NOTE — Transfer of Care (Addendum)
Immediate Anesthesia Transfer of Care Note  Patient: Julia Morris  Procedure(s) Performed: CYSTOSCOPY WITH RIGHT RETROGRADE PYELOGRAM,  AND RIGHT JJ STENT PLACEMENT (N/A Ureter)  Patient Location: PACU  Anesthesia Type:General  Level of Consciousness: awake, alert , oriented and patient cooperative  Airway & Oxygen Therapy: Patient Spontanous Breathing and Patient connected to face mask oxygen  Post-op Assessment: Report given to RN, Post -op Vital signs reviewed and stable and Patient moving all extremities X 4  Post vital signs: stable  Last Vitals:  Vitals Value Taken Time  BP 113/59 09/18/2018  2:00 PM  Temp    Pulse 121 09/18/2018  2:01 PM  Resp 24 09/18/2018  2:01 PM  SpO2 98 % 09/18/2018  2:01 PM  Vitals shown include unvalidated device data.  Last Pain:  Vitals:   09/18/18 1111  TempSrc: Oral  PainSc:          Complications: No apparent anesthesia complications

## 2018-09-19 ENCOUNTER — Encounter (HOSPITAL_COMMUNITY): Payer: Self-pay | Admitting: Urology

## 2018-09-19 LAB — PATHOLOGIST SMEAR REVIEW

## 2018-09-19 LAB — BASIC METABOLIC PANEL
Anion gap: 9 (ref 5–15)
BUN: 19 mg/dL (ref 6–20)
CO2: 18 mmol/L — ABNORMAL LOW (ref 22–32)
Calcium: 7.3 mg/dL — ABNORMAL LOW (ref 8.9–10.3)
Chloride: 109 mmol/L (ref 98–111)
Creatinine, Ser: 1.68 mg/dL — ABNORMAL HIGH (ref 0.44–1.00)
GFR calc Af Amer: 43 mL/min — ABNORMAL LOW (ref >60)
GFR calc non Af Amer: 37 mL/min — ABNORMAL LOW (ref >60)
Glucose, Bld: 161 mg/dL — ABNORMAL HIGH (ref 70–99)
Potassium: 3.7 mmol/L (ref 3.5–5.1)
Sodium: 136 mmol/L (ref 135–145)

## 2018-09-19 LAB — CBC WITH DIFFERENTIAL/PLATELET
Abs Immature Granulocytes: 1.01 10*3/uL — ABNORMAL HIGH (ref 0.00–0.07)
Basophils Absolute: 0 10*3/uL (ref 0.0–0.1)
Basophils Relative: 0 %
EOS ABS: 0 10*3/uL (ref 0.0–0.5)
Eosinophils Relative: 0 %
HCT: 37.5 % (ref 36.0–46.0)
HEMOGLOBIN: 11.7 g/dL — AB (ref 12.0–15.0)
Immature Granulocytes: 6 %
Lymphocytes Relative: 5 %
Lymphs Abs: 0.9 10*3/uL (ref 0.7–4.0)
MCH: 27.8 pg (ref 26.0–34.0)
MCHC: 31.2 g/dL (ref 30.0–36.0)
MCV: 89.1 fL (ref 80.0–100.0)
Monocytes Absolute: 0.7 10*3/uL (ref 0.1–1.0)
Monocytes Relative: 4 %
Neutro Abs: 13.3 10*3/uL — ABNORMAL HIGH (ref 1.7–7.7)
Neutrophils Relative %: 85 %
Platelets: 116 10*3/uL — ABNORMAL LOW (ref 150–400)
RBC: 4.21 MIL/uL (ref 3.87–5.11)
RDW: 15.1 % (ref 11.5–15.5)
WBC: 15.8 10*3/uL — ABNORMAL HIGH (ref 4.0–10.5)
nRBC: 0 % (ref 0.0–0.2)

## 2018-09-19 LAB — URINE CULTURE: Culture: <10000 — AB

## 2018-09-19 LAB — MAGNESIUM: MAGNESIUM: 2.1 mg/dL (ref 1.7–2.4)

## 2018-09-19 LAB — LACTIC ACID, PLASMA: LACTIC ACID, VENOUS: 1.6 mmol/L (ref 0.5–1.9)

## 2018-09-19 MED ORDER — SCOPOLAMINE 1 MG/3DAYS TD PT72: 1.0000 | MEDICATED_PATCH | Freq: Once | TRANSDERMAL | Status: DC

## 2018-09-19 MED ORDER — ONDANSETRON HCL 4 MG/2ML IJ SOLN
4.0000 mg | Freq: Four times a day (QID) | INTRAMUSCULAR | Status: DC | PRN
  Administered 2018-09-19: 4 mg via INTRAVENOUS
  Filled 2018-09-19: qty 2

## 2018-09-19 MED ORDER — SODIUM CHLORIDE 0.9 % IV SOLN
INTRAVENOUS | Status: DC
  Administered 2018-09-19 – 2018-09-22 (×3): via INTRAVENOUS

## 2018-09-19 NOTE — Progress Notes (Signed)
PROGRESS NOTE  Julia Morris SEG:[id number] DOB: [birth year] DOA: 09/17/2018 PCP: Debbrah Alar, NP   LOS: 2 days   Brief narrative: Patientis a 43 y.o.femalewith hx of HTN and recent diagnosis of influenza A on Tamiflu who presented to the ED on 09/17/2018 with progressive R flank plan, difficulty urinating, and worsening fevers/chills.  In the ED, she had fever of 102.8, mild tachycardia, and BP range 90-100/50s.  CT abd/pelvis non contrast showed R hydronephrosis with R perinephric stranding and 71m stone at R UVJ.  UA showed large Hb but otherwise negative. Creatinine elevated to 2.28 from prior Cr 0.7. Patient was admitted for acute right pyelonephritis, right UVJ stone, AKI and influenza A.  Subjective: Patient was seen and examined this morning.  Pleasant young African-American female.  Lying in bed.  Not in distress.  Feels much better today.  Assessment/Plan:  Principal Problem:   Pyelonephritis Active Problems:   Kidney stone   Obstruction of right ureteropelvic junction (UPJ) due to stone   Hydronephrosis, right  #Sepsis secondary to right sided pyelonephritis -although UA is negative, patient presents with fever and has perinephric stranding and CT scan.  She met sepsis criteria on admission.  Sepsis present on admission.  Lactic acid level improved to 1.6 today. Urine culture is growing only less than 10,000 colonies per mL of insignificant growth. Continue IV Rocephin.   #R renal stone 376mat R ureteropelvic junction with hydronephrosis -patient underwent cystoscopy, right retrograde ureteropyelogram, placement of double-J stent on 09/18/2018.  Patient probably passed the stone prior to the procedure.  Oxybutynin added by urologist.  #Acute kidney injury -normal creatinine at baseline, elevated to 2.3 on admission.  Likely secondary to pyelonephritis and probably hydronephrosis. With IV hydration, creatinine is improving.  Down to 1.68 today.  #Influenza A -  diagnosed on 1/31 and started on Tamiflu by PCP.  Continue same.  Continue droplet precautions.  #Hypokalemia - potassium improved from 2.9 yesterday to 3.7 today with replacements.  Monitor.  #Morbid obesity - patient has been advised to make an attempt to improve diet and exercise patterns to aid in weight loss.  #Obstructive sleep apnea -continue to use nightly CPAP.  Mobility: Encourage ambulation. Diet:  Regular diet DVT prophylaxis: Lovenox Code Status:  Code Status: Full Code  Family Communication:  Family member at bedside. Disposition Plan: Home likely tomorrow  Consultants:  Urology  Procedures: Cystoscopy, right retrograde ureteropyelogram, placement of double-J stent on 09/18/2018.  Antimicrobials: Antimicrobial Start date End date  . IV Rocephin  09/17/2018  continue   Infusions: . sodium chloride    . cefTRIAXone (ROCEPHIN)  IV Stopped (09/18/18 1340)    Scheduled Meds: . enoxaparin (LOVENOX) injection  40 mg Subcutaneous Q24H  . oseltamivir  30 mg Oral BID  . scopolamine  1 patch Transdermal Once    PRN meds: acetaminophen, ipratropium-albuterol, morphine injection, oxybutynin   Objective: Vitals:   09/19/18 0124 09/19/18 0502  BP: 121/70 99/78  Pulse: (!) 107 99  Resp: (!) 24 16  Temp: 99.2 F (37.3 C) 99.2 F (37.3 C)  SpO2: 96% 96%    Intake/Output Summary (Last 24 hours) at 09/19/2018 1038 Last data filed at 09/19/2018 [id number] per 24 hour  Intake 2073.04 ml  Output 400 ml  Net 1673.04 ml   Filed Weights   09/17/18 1610  Weight: 111.5 kg   Body mass index is 42.19 kg/m.   Physical Exam: GENERAL: Pleasant young African-American female.  Not in distress HENT: No scleral pallor  or icterus. Pupils equally reactive to light. Oral mucosa is moist NECK: is supple, no palpable thyroid enlargement. CHEST: Clear to auscultation. No crackles or wheezes.  CVS: S1 and S2 heard, no murmur. Regular rate and rhythm. No pericardial  rub. ABDOMEN: Soft, slight tenderness in right flank present, bowel sounds are present. No palpable hepato-splenomegaly. EXTREMITIES: No edema. CNS: Alert, with, oriented x3 SKIN: warm and dry without rashes.  Data Review: I have personally reviewed the laboratory data and studies available.  CBC Latest Ref Rng & Units 09/19/2018 09/18/2018 09/18/2018  WBC 4.0 - 10.5 K/uL 15.8(H) - 15.5(H)  Hemoglobin 12.0 - 15.0 g/dL 11.7(L) 12.9 13.3  Hematocrit 36.0 - 46.0 % 37.5 38.0 42.3  Platelets 150 - 400 K/uL 116(L) - 115(L)    BMP Latest Ref Rng & Units 09/19/2018 09/18/2018 09/18/2018  Glucose 70 - 99 mg/dL 161(H) 113(H) 114(H)  BUN 6 - 20 mg/dL 19 - 23(H)  Creatinine 0.44 - 1.00 mg/dL 1.68(H) - 2.02(H)  Sodium 135 - 145 mmol/L 136 140 136  Potassium 3.5 - 5.1 mmol/L 3.7 3.7 3.2(L)  Chloride 98 - 111 mmol/L 109 - 106  CO2 22 - 32 mmol/L 18(L) - 20(L)  Calcium 8.9 - 10.3 mg/dL 7.3(L) - 7.3(L)    Terrilee Croak, MD  [facility] 09/19/2018

## 2018-09-19 NOTE — Progress Notes (Signed)
1 Day Post-Op Subjective: Patient reports feeling much better. Mild stent sx's  Objective: Vital signs in last 24 hours: Temp:  [98.8 F (37.1 C)-102.7 F (39.3 C)] 99.2 F (37.3 C) (02/03 0502) Pulse Rate:  [99-122] 99 (02/03 0502) Resp:  [16-28] 16 (02/03 0502) BP: (99-121)/(57-78) 99/78 (02/03 0502) SpO2:  [90 %-99 %] 96 % (02/03 0502)  Intake/Output from previous day: 02/02 0701 - 02/03 0700 In: 2073 [I.V.:1973; IV Piggyback:100] Out: 400 [Urine:400] Intake/Output this shift: No intake/output data recorded.  Physical Exam:  Constitutional: Vital signs reviewed. WD WN in NAD   Eyes: PERRL, No scleral icterus.   Cardiovascular: Reg rate Pulmonary/Chest: Normal effort Extremities: No cyanosis or edema   Lab Results: Recent Labs    09/18/18 0501 09/18/18 1302 09/19/18 0506  HGB 13.3 12.9 11.7*  HCT 42.3 38.0 37.5   BMET Recent Labs    09/18/18 1232 09/18/18 1302 09/19/18 0506  NA 136 140 136  K 3.2* 3.7 3.7  CL 106  --  109  CO2 20*  --  18*  GLUCOSE 114* 113* 161*  BUN 23*  --  19  CREATININE 2.02*  --  1.68*  CALCIUM 7.3*  --  7.3*   No results for input(s): LABPT, INR in the last 72 hours. No results for input(s): LABURIN in the last 72 hours. Results for orders placed or performed during the hospital encounter of 09/17/18  Blood culture (routine x 2)     Status: None (Preliminary result)   Collection Time: 09/17/18  8:59 PM  Result Value Ref Range Status   Specimen Description   Final    BLOOD LEFT HAND Performed at [facility], [address] [place], [place] [address]    Special Requests   Final    BOTTLES DRAWN AEROBIC AND ANAEROBIC Blood Culture adequate volume Performed at [facility], [address], [place], [place] [address]    Culture   Final    NO GROWTH 1 DAY Performed at [facility], [place] [address], [place], [place] [address]    Report Status PENDING  Incomplete  Blood culture (routine x 2)      Status: None (Preliminary result)   Collection Time: 09/17/18 10:30 PM  Result Value Ref Range Status   Specimen Description   Final    BLOOD LEFT ANTECUBITAL Performed at [facility], [address] [place], [place] [address]    Special Requests   Final    BOTTLES DRAWN AEROBIC ONLY Blood Culture adequate volume Performed at [facility], [address] [place], [place] [address]    Culture   Final    NO GROWTH < 24 HOURS Performed at [facility], [place] [address], [place], [place] [address]    Report Status PENDING  Incomplete  Surgical pcr screen     Status: None   Collection Time: 09/18/18 11:12 AM  Result Value Ref Range Status   MRSA, PCR NEGATIVE NEGATIVE Final   Staphylococcus aureus NEGATIVE NEGATIVE Final    Comment: (NOTE) The Xpert SA Assay (FDA approved for NASAL specimens in patients 42 years of age and older), is one component of a comprehensive surveillance program. It is not intended to diagnose infection nor to guide or monitor treatment. Performed at [facility], [place] [address], [place], [place] [address]     Studies/Results: Dg C-arm 1-60 Min-no Report  Result Date: 09/18/2018 Fluoroscopy was  utilized by the requesting physician.  No radiographic interpretation.   Ct Renal Stone Study  Result Date: 09/17/2018 CLINICAL DATA:  Right flank pain since yesterday, urinary urgency, dysuria, no hx renal stones EXAM: CT ABDOMEN AND PELVIS WITHOUT CONTRAST TECHNIQUE: Multidetector CT imaging of the abdomen and pelvis was performed following the standard protocol without IV contrast. COMPARISON:  None. FINDINGS: Lower chest: Clear lung bases.  Heart normal in size. Hepatobiliary: Diffuse decreased attenuation of the liver consistent with fatty infiltration. Liver mildly enlarged. No liver mass or focal lesion. Gallbladder is unremarkable. No bile duct dilation Pancreas: Unremarkable. No pancreatic ductal dilatation or  surrounding inflammatory changes. Spleen: Normal in size without focal abnormality. Adrenals/Urinary Tract: No adrenal masses. There is mild right hydronephrosis with more significant right perinephric stranding. Stranding extends to the proximal right ureter. Findings are due to a 3 mm stone at the right ureterovesicular junction. No left hydronephrosis or perinephric stranding. No intrarenal stones. No renal masses. Left ureter is normal course and in caliber. Bladder is unremarkable. Stomach/Bowel: Stomach is unremarkable. Small bowel and colon are normal in caliber. No wall thickening. No inflammation. Vascular/Lymphatic: No significant vascular findings are present. No enlarged abdominal or pelvic lymph nodes. Reproductive: Well-positioned IUD. Uterus and adnexa otherwise unremarkable. Other: No abdominal wall hernia or abnormality. No abdominopelvic ascites. Musculoskeletal: No acute or significant osseous findings. IMPRESSION: 1. 3 mm stone at the right ureterovesicular junction leads to mild right hydroureteronephrosis with prominent right perinephric stranding. 2. No other acute abnormalities.  No intrarenal stones. 3. Mild hepatomegaly with diffuse hepatic steatosis. Electronically Signed   By: Lajean Manes M.D.   On: 09/17/2018 17:59    Assessment/Plan:   Pt much improved.   I wrote for oxybutynin for OAB sx's from stent--can go home w/ this  We will call her to set up stent removal in 1-2 weeks  I'll sign off--call if more assistance needed   LOS: 2 days   Jorja Loa 09/19/2018, 8:48 AM

## 2018-09-20 LAB — CBC WITH DIFFERENTIAL/PLATELET
Abs Immature Granulocytes: 0.07 10*3/uL (ref 0.00–0.07)
Basophils Absolute: 0 10*3/uL (ref 0.0–0.1)
Basophils Relative: 0 %
Eosinophils Absolute: 0 10*3/uL (ref 0.0–0.5)
Eosinophils Relative: 0 %
HCT: 37.8 % (ref 36.0–46.0)
HEMOGLOBIN: 12 g/dL (ref 12.0–15.0)
Immature Granulocytes: 0 %
Lymphocytes Relative: 9 %
Lymphs Abs: 1.5 10*3/uL (ref 0.7–4.0)
MCH: 27.1 pg (ref 26.0–34.0)
MCHC: 31.7 g/dL (ref 30.0–36.0)
MCV: 85.5 fL (ref 80.0–100.0)
Monocytes Absolute: 0.8 10*3/uL (ref 0.1–1.0)
Monocytes Relative: 5 %
Neutro Abs: 13.8 10*3/uL — ABNORMAL HIGH (ref 1.7–7.7)
Neutrophils Relative %: 86 %
Platelets: 161 10*3/uL (ref 150–400)
RBC: 4.42 MIL/uL (ref 3.87–5.11)
RDW: 15 % (ref 11.5–15.5)
WBC: 16.3 10*3/uL — ABNORMAL HIGH (ref 4.0–10.5)
nRBC: 0 % (ref 0.0–0.2)

## 2018-09-20 LAB — BASIC METABOLIC PANEL
Anion gap: 9 (ref 5–15)
BUN: 17 mg/dL (ref 6–20)
CHLORIDE: 109 mmol/L (ref 98–111)
CO2: 19 mmol/L — ABNORMAL LOW (ref 22–32)
Calcium: 7.7 mg/dL — ABNORMAL LOW (ref 8.9–10.3)
Creatinine, Ser: 1.37 mg/dL — ABNORMAL HIGH (ref 0.44–1.00)
GFR calc Af Amer: 55 mL/min — ABNORMAL LOW (ref >60)
GFR calc non Af Amer: 48 mL/min — ABNORMAL LOW (ref >60)
Glucose, Bld: 117 mg/dL — ABNORMAL HIGH (ref 70–99)
Potassium: 3.4 mmol/L — ABNORMAL LOW (ref 3.5–5.1)
Sodium: 137 mmol/L (ref 135–145)

## 2018-09-20 LAB — LACTIC ACID, PLASMA
LACTIC ACID, VENOUS: 1.5 mmol/L (ref 0.5–1.9)
Lactic Acid, Venous: 1.4 mmol/L (ref 0.5–1.9)

## 2018-09-20 LAB — MAGNESIUM: Magnesium: 2.3 mg/dL (ref 1.7–2.4)

## 2018-09-20 MED ORDER — OXYBUTYNIN CHLORIDE 5 MG PO TABS
5.0000 mg | ORAL_TABLET | Freq: Three times a day (TID) | ORAL | Status: DC
  Administered 2018-09-20 – 2018-09-22 (×6): 5 mg via ORAL
  Filled 2018-09-20 (×5): qty 1

## 2018-09-20 MED ORDER — OSELTAMIVIR PHOSPHATE 75 MG PO CAPS
75.0000 mg | ORAL_CAPSULE | Freq: Two times a day (BID) | ORAL | Status: DC
  Administered 2018-09-20 – 2018-09-22 (×4): 75 mg via ORAL
  Filled 2018-09-20 (×4): qty 1

## 2018-09-20 MED ORDER — SODIUM CHLORIDE 0.9 % IV BOLUS
500.0000 mL | Freq: Once | INTRAVENOUS | Status: AC
  Administered 2018-09-20: 500 mL via INTRAVENOUS

## 2018-09-20 MED ORDER — SODIUM CHLORIDE 0.9 % IV SOLN
1.0000 g | Freq: Three times a day (TID) | INTRAVENOUS | Status: DC
  Administered 2018-09-20 – 2018-09-22 (×6): 1 g via INTRAVENOUS
  Filled 2018-09-20 (×7): qty 1

## 2018-09-20 MED ORDER — ACETAMINOPHEN 325 MG PO TABS
650.0000 mg | ORAL_TABLET | ORAL | Status: DC | PRN
  Administered 2018-09-20 – 2018-09-21 (×4): 650 mg via ORAL
  Filled 2018-09-20 (×4): qty 2

## 2018-09-20 MED ORDER — MORPHINE SULFATE (PF) 2 MG/ML IV SOLN
2.0000 mg | INTRAVENOUS | Status: DC | PRN
  Administered 2018-09-20 – 2018-09-22 (×7): 2 mg via INTRAVENOUS
  Filled 2018-09-20 (×7): qty 1

## 2018-09-20 MED ORDER — POTASSIUM CHLORIDE CRYS ER 20 MEQ PO TBCR
40.0000 meq | EXTENDED_RELEASE_TABLET | Freq: Once | ORAL | Status: AC
  Administered 2018-09-20: 40 meq via ORAL
  Filled 2018-09-20: qty 2

## 2018-09-20 NOTE — Progress Notes (Signed)
PROGRESS NOTE  Julia Morris ZOX:[id number] DOB: 13-Sep-1975 DOA: 09/17/2018 PCP: Debbrah Alar, NP   LOS: 3 days   Brief narrative: Patientis a 43 y.o.femalewith hx of HTN and recent diagnosis of influenzaAon Tamifluwho presentedto the ED on 09/17/2018 with progressive R flank plan, difficulty urinating, and worsening fevers/chills. In the ED, she hadfever of102.8, mild tachycardia, and BP range 90-100/50s.  CT abd/pelvis non contrast showed R hydronephrosis with R perinephric stranding and 61mm stone at R UVJ.  UA showedlarge Hb but otherwise negative. Creatinine elevated to 2.28 from prior Cr 0.7. Lactic acid was elevated to 2.5. Patient was admitted for acute right pyelonephritis, right UVJ stone, AKI and influenza A.  On 09/18/2018, patient underwent cystoscopy, right retrograde ureteropyelogram, placement of double-J stent.  She was planned for discharge today but need to hold the discharge because of recurrence of fever and persistence of leukocytosis.  Subjective: Patient was seen and examined this morning.  Patient was feeling better at that time.  T-max 100.9 last night, WBC count remains elevated at 16,000.  This afternoon, patient also complained of generalized abdominal pain and had a recurrence of fever to 101.5.  Assessment/Plan:  Principal Problem:   Pyelonephritis Active Problems:   Kidney stone   Obstruction of right ureteropelvic junction (UPJ) due to stone   Hydronephrosis, right  #R renal stone 73mm at R ureteropelvic junction with hydronephrosis-patient underwent cystoscopy, right retrograde ureteropyelogram, placement of double-J stent on 09/18/2018.  Per urology note, patient probably passed the stone prior to the procedure. Oxybutynin added by urologist.  #Sepsis secondary to right sided pyelonephritis-patient was septic at presentation.  It initially improved with IV Rocephin, IV fluid and stent placement.  However, patient has a fever spike again  today up to one 1.5.  WBC count this morning remained elevated at 16.5 against expectation.   For now I will repeat blood culture and lactic acid.. I escalated the antibiotic to IV cefepime. Tylenol for fever.  If continues to spike, we may need to repeat CT scan of abdomen pelvis and reconsult urology.  #Acute kidney injury-normal creatinine at baseline, elevated to 2.3 on admission. Likely secondary to pyelonephritis and probably hydronephrosis. With IV hydration, creatinine is improving.  Down to 1.37 today.  #Influenza A- diagnosed on 1/31 and started on Tamiflu by PCP. Last dose tomorrow on 2/5. Continue droplet precautions.  #Hypokalemia- potassium improved with replacement.  Repeat tomorrow.  #Morbid obesity - patient has been advisedtomake an attempt to improve diet and exercise patterns to aid in weight loss.  #Obstructive sleep apnea -continue to use nightly CPAP.  Mobility:Encourage ambulation. Diet: Regular diet DVT prophylaxis:Lovenox Code Status:Full Code Family Communication: Family member not at bedside today. Disposition Plan:Hold discharge today because of recurrence of fever.  Consultants:  Urology  Procedures: Cystoscopy, right retrograde ureteropyelogram, placement of double-J stent on 09/18/2018.  Antimicrobials:  Anti-infectives (From admission, onward)   Start     Dose/Rate Route Frequency Ordered Stop   09/20/18 2200  oseltamivir (TAMIFLU) capsule 75 mg     75 mg Oral 2 times daily 09/20/18 1638 09/23/18 0959   09/20/18 1700  ceFEPIme (MAXIPIME) 1 g in sodium chloride 0.9 % 100 mL IVPB     1 g 200 mL/hr over 30 Minutes Intravenous Every 8 hours 09/20/18 1629     09/18/18 1515  cefTRIAXone (ROCEPHIN) 1 g in sodium chloride 0.9 % 100 mL IVPB  Status:  Discontinued    Note to Pharmacy:  Patient was given this antibiotic in  the operating room.   1 g 200 mL/hr over 30 Minutes Intravenous  Once 09/18/18 1511 09/18/18 1512   09/18/18 1259   sodium chloride 0.9 % with cefTRIAXone (ROCEPHIN) ADS Med    Note to Pharmacy:  Key, Kristopher   : cabinet override      09/18/18 1259 09/19/18 0114   09/18/18 1200  cefTRIAXone (ROCEPHIN) 1 g in sodium chloride 0.9 % 100 mL IVPB  Status:  Discontinued     1 g 200 mL/hr over 30 Minutes Intravenous Every 24 hours 09/18/18 0253 09/20/18 1629   09/18/18 1000  oseltamivir (TAMIFLU) capsule 30 mg  Status:  Discontinued     30 mg Oral 2 times daily 09/18/18 0253 09/20/18 1638   09/17/18 1830  cefTRIAXone (ROCEPHIN) 1 g in sodium chloride 0.9 % 100 mL IVPB     1 g 200 mL/hr over 30 Minutes Intravenous  Once 09/17/18 1826 09/17/18 1951      Infusions:  . sodium chloride 75 mL/hr at 09/20/18 1235  . ceFEPime (MAXIPIME) IV      Scheduled Meds: . enoxaparin (LOVENOX) injection  40 mg Subcutaneous Q24H  . oseltamivir  75 mg Oral BID  . oxybutynin  5 mg Oral TID  . scopolamine  1 patch Transdermal Once    PRN meds: ipratropium-albuterol, morphine injection, ondansetron (ZOFRAN) IV, oxybutynin   Objective: Vitals:   09/20/18 0513 09/20/18 1603  BP:  (!) 148/90  Pulse:  99  Resp:  20  Temp: 99.4 F (37.4 C) (!) 101.5 F (38.6 C)  SpO2:  93%    Intake/Output Summary (Last 24 hours) at 09/20/2018 1639 Last data filed at 09/20/2018 0300 Gross per 24 hour  Intake 1723.18 ml  Output 1500 ml  Net 223.18 ml   02/03 0701 - 02/04 0700 In: 1723.2 [P.O.:480; I.V.:1143.2; IV Piggyback:100] Out: 2150 [Urine:2150] No intake/output data recorded. Filed Weights   09/17/18 1610  Weight: 111.5 kg   Body mass index is 42.19 kg/m.   Physical Exam this morning: GENERAL: Pleasant young African-American female, morbidly obese. HENT: No scleral pallor or icterus. Pupils equally reactive to light. Oral mucosa is moist NECK: is supple, no palpable thyroid enlargement. CHEST: Clear to auscultation. No crackles or wheezes.  CVS: S1 and S2 heard, no murmur. Regular rate and rhythm. No pericardial  rub. ABDOMEN: Soft, tenderness mild in the right flank No palpable hepato-splenomegaly. EXTREMITIES: No edema. CNS: Alert, awake monitor x3 SKIN: warm and dry without rashes.  Data Review: I have personally reviewed the laboratory data and studies available.  Recent Labs  Lab 09/17/18 1721 09/18/18 0501 09/18/18 1302 09/19/18 0506 09/20/18 0337  WBC 16.8* 15.5*  --  15.8* 16.3*  NEUTROABS 15.9* 14.1*  --  13.3* 13.8*  HGB 13.7 13.3 12.9 11.7* 12.0  HCT 43.1 42.3 38.0 37.5 37.8  MCV 84.7 86.0  --  89.1 85.5  PLT 173 115*  --  116* 161   Recent Labs  Lab 09/17/18 1721 09/18/18 0501 09/18/18 1232 09/18/18 1302 09/19/18 0506 09/20/18 0337  NA 129* 135 136 140 136 137  K 2.9* 2.9* 3.2* 3.7 3.7 3.4*  CL 95* 105 106  --  109 109  CO2 20* 18* 20*  --  18* 19*  GLUCOSE 93 102* 114* 113* 161* 117*  BUN 22* 26* 23*  --  19 17  CREATININE 2.28* 2.47* 2.02*  --  1.68* 1.37*  CALCIUM 8.5* 7.2* 7.3*  --  7.3* 7.7*  MG 1.4*  --   --   --  2.1 2.3   Julia Croak, MD  [facility] 09/20/2018

## 2018-09-21 ENCOUNTER — Inpatient Hospital Stay (HOSPITAL_COMMUNITY): Payer: BLUE CROSS/BLUE SHIELD

## 2018-09-21 LAB — CBC WITH DIFFERENTIAL/PLATELET
Abs Immature Granulocytes: 0.23 10*3/uL — ABNORMAL HIGH (ref 0.00–0.07)
BASOS ABS: 0 10*3/uL (ref 0.0–0.1)
Basophils Relative: 0 %
Eosinophils Absolute: 0 10*3/uL (ref 0.0–0.5)
Eosinophils Relative: 0 %
HCT: 33.3 % — ABNORMAL LOW (ref 36.0–46.0)
Hemoglobin: 10.4 g/dL — ABNORMAL LOW (ref 12.0–15.0)
Immature Granulocytes: 2 %
Lymphocytes Relative: 17 %
Lymphs Abs: 2.4 10*3/uL (ref 0.7–4.0)
MCH: 27.3 pg (ref 26.0–34.0)
MCHC: 31.2 g/dL (ref 30.0–36.0)
MCV: 87.4 fL (ref 80.0–100.0)
Monocytes Absolute: 1.6 10*3/uL — ABNORMAL HIGH (ref 0.1–1.0)
Monocytes Relative: 11 %
NRBC: 0 % (ref 0.0–0.2)
Neutro Abs: 9.6 10*3/uL — ABNORMAL HIGH (ref 1.7–7.7)
Neutrophils Relative %: 70 %
PLATELETS: 165 10*3/uL (ref 150–400)
RBC: 3.81 MIL/uL — ABNORMAL LOW (ref 3.87–5.11)
RDW: 15 % (ref 11.5–15.5)
WBC: 13.8 10*3/uL — AB (ref 4.0–10.5)

## 2018-09-21 LAB — BASIC METABOLIC PANEL
Anion gap: 8 (ref 5–15)
BUN: 14 mg/dL (ref 6–20)
CALCIUM: 7.7 mg/dL — AB (ref 8.9–10.3)
CO2: 19 mmol/L — ABNORMAL LOW (ref 22–32)
Chloride: 110 mmol/L (ref 98–111)
Creatinine, Ser: 1.21 mg/dL — ABNORMAL HIGH (ref 0.44–1.00)
GFR calc Af Amer: >60 mL/min (ref >60)
GFR, EST NON AFRICAN AMERICAN: 56 mL/min — AB (ref >60)
Glucose, Bld: 102 mg/dL — ABNORMAL HIGH (ref 70–99)
Potassium: 3.1 mmol/L — ABNORMAL LOW (ref 3.5–5.1)
SODIUM: 137 mmol/L (ref 135–145)

## 2018-09-21 LAB — MAGNESIUM: Magnesium: 2.1 mg/dL (ref 1.7–2.4)

## 2018-09-21 MED ORDER — IOHEXOL 300 MG/ML  SOLN
100.0000 mL | Freq: Once | INTRAMUSCULAR | Status: AC | PRN
  Administered 2018-09-21: 100 mL via INTRAVENOUS

## 2018-09-21 MED ORDER — POTASSIUM CHLORIDE CRYS ER 20 MEQ PO TBCR
40.0000 meq | EXTENDED_RELEASE_TABLET | Freq: Once | ORAL | Status: AC
  Administered 2018-09-21: 40 meq via ORAL
  Filled 2018-09-21: qty 2

## 2018-09-21 MED ORDER — SODIUM CHLORIDE (PF) 0.9 % IJ SOLN
INTRAMUSCULAR | Status: AC
  Filled 2018-09-21: qty 50

## 2018-09-21 NOTE — Progress Notes (Signed)
PROGRESS NOTE    Julia Morris  NOB:[id number] DOB: July 30, 1976 DOA: 09/17/2018 PCP: Debbrah Alar, NP   Brief Narrative:  43 year old with history of essential hypertension came to the hospital with complains of right-sided flank pain and difficulty urinating.  She was found to have right-sided hydronephrosis with stone which was 3 mm.   Assessment & Plan:   Principal Problem:   Pyelonephritis Active Problems:   Kidney stone   Obstruction of right ureteropelvic junction (UPJ) due to stone   Hydronephrosis, right  Sepsis secondary to right-sided pyelonephritis, present on admission Obstructive uropathy 3 mm on the right side with hydronephrosis, status post stent placement 10/06/2018 -She underwent stent placement by urology on 09/18/2018 and started on oxybutynin.  Due to concerns of urinary tract infection she was started on Rocephin but eventually broadened to cefepime due to fevers. -Follow-up outpatient with urology in about 10 days for stent removal -Follow-up repeat cultures  Influenza type a -Currently on Tamiflu day 5.  Continue droplet precaution.  Morbid obesity with BMI greater than 40 -Counseled on weight loss diet and exercise  Obstructive sleep apnea -Nocturnal CPAP   DVT prophylaxis: Lovenox Code Status: Full code Family Communication: None at bedside Disposition Plan: If she remains afebrile for next 24 hours, we will discharge her on oral antibiotics  Consultants:   Urology  Procedures:   Double-J stent placement 2/2  Antimicrobials:   Rocephin  Cefepime  Tamiflu   Subjective: Patient had fever yesterday and in the evening as well.  This morning feels a little weak and not back to her baseline yet.  Review of Systems Otherwise negative except as per HPI, including: General: Denies fever, chills, night sweats or unintended weight loss. Resp: Denies cough, wheezing, shortness of breath. Cardiac: Denies chest pain, palpitations,  orthopnea, paroxysmal nocturnal dyspnea. GI: Denies abdominal pain, nausea, vomiting, diarrhea or constipation GU: Denies dysuria, frequency, hesitancy or incontinence MS: Denies muscle aches, joint pain or swelling Neuro: Denies headache, neurologic deficits (focal weakness, numbness, tingling), abnormal gait Psych: Denies anxiety, depression, SI/HI/AVH Skin: Denies new rashes or lesions ID: Denies sick contacts, exotic exposures, travel  Objective: Vitals:   09/20/18 2210 09/21/18 0006 09/21/18 0526 09/21/18 0653  BP: (!) 146/87  101/80   Pulse: 92  91   Resp: 20  (!) 24   Temp: (!) 102.2 F (39 C) 100.2 F (37.9 C) (!) 101.3 F (38.5 C) 99.5 F (37.5 C)  TempSrc: Oral Oral Oral Oral  SpO2: 91%  91%   Weight:      Height:        Intake/Output Summary (Last 24 hours) at 09/21/2018 1201 Last data filed at 09/21/2018 1015 Gross per 24 hour  Intake 2723.38 ml  Output 2400 ml  Net 323.38 ml   Filed Weights   09/17/18 1610  Weight: 111.5 kg    Examination:  General exam: Appears calm and comfortable  Respiratory system: Clear to auscultation. Respiratory effort normal. Cardiovascular system: S1 & S2 heard, RRR. No JVD, murmurs, rubs, gallops or clicks. No pedal edema. Gastrointestinal system: Abdomen is nondistended, soft and nontender. No organomegaly or masses felt. Normal bowel sounds heard. Central nervous system: Alert and oriented. No focal neurological deficits. Extremities: Symmetric 5 x 5 power. Skin: No rashes, lesions or ulcers Psychiatry: Judgement and insight appear normal. Mood & affect appropriate.     Data Reviewed:   CBC: Recent Labs  Lab 09/17/18 1721 09/18/18 0501 09/18/18 1302 09/19/18 0506 09/20/18 0337 09/21/18 0335  WBC  16.8* 15.5*  --  15.8* 16.3* 13.8*  NEUTROABS 15.9* 14.1*  --  13.3* 13.8* 9.6*  HGB 13.7 13.3 12.9 11.7* 12.0 10.4*  HCT 43.1 42.3 38.0 37.5 37.8 33.3*  MCV 84.7 86.0  --  89.1 85.5 87.4  PLT 173 115*  --  116* 161 694    Basic Metabolic Panel: Recent Labs  Lab 09/17/18 1721 09/18/18 0501 09/18/18 1232 09/18/18 1302 09/19/18 0506 09/20/18 0337 09/21/18 0335  NA 129* 135 136 140 136 137 137  K 2.9* 2.9* 3.2* 3.7 3.7 3.4* 3.1*  CL 95* 105 106  --  109 109 110  CO2 20* 18* 20*  --  18* 19* 19*  GLUCOSE 93 102* 114* 113* 161* 117* 102*  BUN 22* 26* 23*  --  19 17 14   CREATININE 2.28* 2.47* 2.02*  --  1.68* 1.37* 1.21*  CALCIUM 8.5* 7.2* 7.3*  --  7.3* 7.7* 7.7*  MG 1.4*  --   --   --  2.1 2.3 2.1   GFR: Estimated Creatinine Clearance: 74.8 mL/min (A) (by C-G formula based on SCr of 1.21 mg/dL (H)). Liver Function Tests: Recent Labs  Lab 09/17/18 1721  AST 86*  ALT 49*  ALKPHOS 62  BILITOT 0.9  PROT 8.2*  ALBUMIN 3.8   No results for input(s): LIPASE, AMYLASE in the last 168 hours. No results for input(s): AMMONIA in the last 168 hours. Coagulation Profile: No results for input(s): INR, PROTIME in the last 168 hours. Cardiac Enzymes: No results for input(s): CKTOTAL, CKMB, CKMBINDEX, TROPONINI in the last 168 hours. BNP (last 3 results) No results for input(s): PROBNP in the last 8760 hours. HbA1C: No results for input(s): HGBA1C in the last 72 hours. CBG: No results for input(s): GLUCAP in the last 168 hours. Lipid Profile: No results for input(s): CHOL, HDL, LDLCALC, TRIG, CHOLHDL, LDLDIRECT in the last 72 hours. Thyroid Function Tests: No results for input(s): TSH, T4TOTAL, FREET4, T3FREE, THYROIDAB in the last 72 hours. Anemia Panel: No results for input(s): VITAMINB12, FOLATE, FERRITIN, TIBC, IRON, RETICCTPCT in the last 72 hours. Sepsis Labs: Recent Labs  Lab 09/18/18 1232 09/19/18 0506 09/20/18 1704 09/20/18 1931  LATICACIDVEN 2.5* 1.6 1.4 1.5    Recent Results (from the past 240 hour(s))  Blood culture (routine x 2)     Status: None (Preliminary result)   Collection Time: 09/17/18  8:59 PM  Result Value Ref Range Status   Specimen Description   Final    BLOOD  LEFT HAND Performed at [facility], [address] [place], [place] [id number]    Special Requests   Final    BOTTLES DRAWN AEROBIC AND ANAEROBIC Blood Culture adequate volume Performed at [facility], [address] [place], [place] [address]    Culture   Final    NO GROWTH 2 DAYS Performed at [facility], [place] [address], [place],  [address]    Report Status PENDING  Incomplete  Blood culture (routine x 2)     Status: None (Preliminary result)   Collection Time: 09/17/18 10:30 PM  Result Value Ref Range Status   Specimen Description   Final    BLOOD LEFT ANTECUBITAL Performed at [facility], [address] [place], [place] [address]    Special Requests   Final    BOTTLES DRAWN AEROBIC ONLY Blood Culture adequate volume Performed at [facility], [address] [place], [place]  [address]    Culture   Final    NO GROWTH 2 DAYS Performed at [facility], [place] [address], [place], [place] [address]    Report Status PENDING  Incomplete  Urine culture     Status: Abnormal   Collection Time: 09/17/18 10:35 PM  Result Value Ref Range Status   Specimen Description URINE, RANDOM  Final   Special Requests   Final    NONE Performed at [facility], [address] [place], [place] [address]    Culture <10,000 COLONIES/mL INSIGNIFICANT GROWTH (A)  Final   Report Status 09/19/2018 FINAL  Final  Surgical pcr screen     Status: None   Collection Time: 09/18/18 11:12 AM  Result Value Ref Range Status   MRSA, PCR NEGATIVE NEGATIVE Final   Staphylococcus aureus NEGATIVE NEGATIVE Final    Comment: (NOTE) The Xpert SA Assay (FDA approved for NASAL specimens in patients 56 years of age and older), is one component of a comprehensive surveillance program. It is not intended to diagnose infection nor to guide or monitor treatment. Performed at [facility], [place] [address] [place], [place]  [address]          Radiology Studies: No results found.      Scheduled Meds: . enoxaparin (LOVENOX) injection  40 mg Subcutaneous Q24H  . oseltamivir  75 mg Oral BID  . oxybutynin  5 mg Oral TID  . scopolamine  1 patch Transdermal Once   Continuous Infusions: . sodium chloride 75 mL/hr at 09/21/18 0500  . ceFEPime (MAXIPIME) IV 1 g (09/21/18 1054)     LOS: 4 days   Time spent= 35 mins    Chaniya Genter Arsenio Loader, MD [facility]  If 7PM-7AM, please contact night-coverage [email or web address] 09/21/2018, 12:01 PM

## 2018-09-22 LAB — CBC
HCT: 34.5 % — ABNORMAL LOW (ref 36.0–46.0)
Hemoglobin: 10.9 g/dL — ABNORMAL LOW (ref 12.0–15.0)
MCH: 26.9 pg (ref 26.0–34.0)
MCHC: 31.6 g/dL (ref 30.0–36.0)
MCV: 85.2 fL (ref 80.0–100.0)
Platelets: 207 10*3/uL (ref 150–400)
RBC: 4.05 MIL/uL (ref 3.87–5.11)
RDW: 14.6 % (ref 11.5–15.5)
WBC: 12.4 10*3/uL — ABNORMAL HIGH (ref 4.0–10.5)
nRBC: 0 % (ref 0.0–0.2)

## 2018-09-22 LAB — BASIC METABOLIC PANEL
Anion gap: 9 (ref 5–15)
BUN: 10 mg/dL (ref 6–20)
CO2: 19 mmol/L — AB (ref 22–32)
Calcium: 8 mg/dL — ABNORMAL LOW (ref 8.9–10.3)
Chloride: 108 mmol/L (ref 98–111)
Creatinine, Ser: 0.96 mg/dL (ref 0.44–1.00)
GFR calc Af Amer: >60 mL/min (ref >60)
GFR calc non Af Amer: >60 mL/min (ref >60)
Glucose, Bld: 97 mg/dL (ref 70–99)
Potassium: 3.2 mmol/L — ABNORMAL LOW (ref 3.5–5.1)
Sodium: 136 mmol/L (ref 135–145)

## 2018-09-22 LAB — MAGNESIUM: Magnesium: 1.8 mg/dL (ref 1.7–2.4)

## 2018-09-22 MED ORDER — CIPROFLOXACIN HCL 500 MG PO TABS: 500.0000 mg | ORAL_TABLET | Freq: Two times a day (BID) | ORAL | 0 refills | Status: AC

## 2018-09-22 MED ORDER — OXYBUTYNIN CHLORIDE 5 MG PO TABS: 5.0000 mg | ORAL_TABLET | Freq: Three times a day (TID) | ORAL | 0 refills | Status: AC

## 2018-09-22 MED ORDER — SENNOSIDES-DOCUSATE SODIUM 8.6-50 MG PO TABS
2.0000 | ORAL_TABLET | Freq: Every evening | ORAL | Status: DC | PRN
  Administered 2018-09-22: 2 via ORAL
  Filled 2018-09-22: qty 2

## 2018-09-22 MED ORDER — SENNOSIDES-DOCUSATE SODIUM 8.6-50 MG PO TABS: 2.0000 | ORAL_TABLET | Freq: Every evening | ORAL | 0 refills | Status: AC | PRN

## 2018-09-22 MED ORDER — POLYETHYLENE GLYCOL 3350 17 G PO PACK: 17.0000 g | PACK | Freq: Every day | ORAL | 0 refills | Status: AC | PRN

## 2018-09-22 MED ORDER — OXYCODONE-ACETAMINOPHEN 5-325 MG PO TABS: 1.0000 | ORAL_TABLET | ORAL | 0 refills | Status: AC | PRN

## 2018-09-22 MED ORDER — POLYETHYLENE GLYCOL 3350 17 G PO PACK: 17.0000 g | PACK | Freq: Every day | ORAL | Status: DC | PRN

## 2018-09-22 MED ORDER — POTASSIUM CHLORIDE CRYS ER 20 MEQ PO TBCR
40.0000 meq | EXTENDED_RELEASE_TABLET | Freq: Once | ORAL | Status: AC
  Administered 2018-09-22: 40 meq via ORAL
  Filled 2018-09-22: qty 2

## 2018-09-22 NOTE — Discharge Summary (Signed)
Physician Discharge Summary  Julia Morris KXF:[id number] DOB: [birth year] DOA: 09/17/2018  PCP: Debbrah Alar, NP  Admit date: 09/17/2018 Discharge date: 09/22/2018  Admitted From: Home Disposition: Home  Recommendations for Outpatient Follow-up:  1. Follow up with PCP in 1-2 weeks 2. Please obtain BMP/CBC in one week your next doctors visit.  3. Take Cipro orally for 7 days 4. Follow-up with outpatient neurology in 7 days.  Prescription for oxybutynin and oxycodone given.  Bowel regimen prescription given.  Home Health: None Equipment/Devices: None Discharge Condition: Stable CODE STATUS: Full code Diet recommendation: Regular  Brief/Interim Summary: 43 year old with history of essential hypertension came to the hospital with complains of right-sided flank pain and difficulty urinating.  She was found to have right-sided hydronephrosis with stone which was 3 mm.  Patient underwent stent placement on 09/18/2018 and tolerated the procedure well.  Postoperatively she was started on Rocephin but due to some persistent fever she was switched over to cefepime.  She was also diagnosed of flu therefore received 5 days of Tamiflu in the hospital.  Day prior to her discharge today reported of generalized abdominal pain therefore CT of the abdomen pelvis was done which showed right-sided pyelonephritis but no other acute pathology.  Cultures otherwise remain negative. Today she is reached maximum benefit from hospital stay and stable for discharge with outpatient follow-up recommendations as stated above.   Discharge Diagnoses:  Principal Problem:   Pyelonephritis Active Problems:   Kidney stone   Obstruction of right ureteropelvic junction (UPJ) due to stone   Hydronephrosis, right  Sepsis secondary to right-sided pyelonephritis, present on admission Obstructive uropathy 3 mm on the right side with hydronephrosis, status post stent placement 10/06/2018 -She underwent stent placement by  urology on 09/18/2018 and started on oxybutynin.  Due to concerns of urinary tract infection she was started on Rocephin but eventually broadened to cefepime due to fevers.Doing better today, therefore will switch over to oral Cipro to be taken for 7 more days and follow-up outpatient with urology.  She Artie has an appointment. -Cultures are thus far negative. -Prescription for oxycodone and oxybutynin given.  Bowel regimen prescribed. -CT of the abdomen pelvis- shows right-sided pyelonephritis otherwise no other acute pathology.  Influenza type a -Completed 5-day course of Tamiflu  Morbid obesity with BMI greater than 40 -Counseled on weight loss diet and exercise  Obstructive sleep apnea -Nocturnal CPAP   DVT prophylaxis: Lovenox Code Status: Full code Family Communication: None at bedside Disposition Plan:  Discharge today  Discharge Instructions   Allergies as of 09/22/2018      Reactions   Prednisone Swelling      Medication List    STOP taking these medications   oseltamivir 75 MG capsule Commonly known as:  TAMIFLU     TAKE these medications   acetaminophen 500 MG tablet Commonly known as:  TYLENOL Take 1,000 mg by mouth every 6 (six) hours as needed for mild pain.   benzonatate 100 MG capsule Commonly known as:  TESSALON Take 1 capsule (100 mg total) by mouth 3 (three) times daily as needed.   ciprofloxacin 500 MG tablet Commonly known as:  CIPRO Take 1 tablet (500 mg total) by mouth 2 (two) times daily for 7 days.   hydrochlorothiazide 25 MG tablet Commonly known as:  HYDRODIURIL Take 1 tablet (25 mg total) by mouth daily.   levonorgestrel 20 MCG/24HR IUD Commonly known as:  MIRENA 1 each by Intrauterine route once.   oxybutynin 5 MG tablet Commonly  known as:  DITROPAN Take 1 tablet (5 mg total) by mouth 3 (three) times daily for 30 days.   oxyCODONE-acetaminophen 5-325 MG tablet Commonly known as:  PERCOCET/ROXICET Take 1 tablet by mouth every  4 (four) hours as needed for up to 7 days for severe pain.   polyethylene glycol packet Commonly known as:  MIRALAX / GLYCOLAX Take 17 g by mouth daily as needed for moderate constipation or severe constipation.   senna-docusate 8.6-50 MG tablet Commonly known as:  Senokot-S Take 2 tablets by mouth at bedtime as needed for mild constipation or moderate constipation (use first).      Follow-up Information    Debbrah Alar, NP. Schedule an appointment as soon as possible for a visit in 1 week(s).   Specialty:  Internal Medicine Contact information: [place] [address] [place] [address] [phone]        Franchot Gallo, MD. Schedule an appointment as soon as possible for a visit in 7 day(s).   Specialty:  Urology Contact information: [address] [place] [address] [phone]          Allergies  Allergen Reactions  . Prednisone Swelling    You were cared for by a hospitalist during your hospital stay. If you have any questions about your discharge medications or the care you received while you were in the hospital after you are discharged, you can call the unit and asked to speak with the hospitalist on call if the hospitalist that took care of you is not available. Once you are discharged, your primary care physician will handle any further medical issues. Please note that no refills for any discharge medications will be authorized once you are discharged, as it is imperative that you return to your primary care physician (or establish a relationship with a primary care physician if you do not have one) for your aftercare needs so that they can reassess your need for medications and monitor your lab values.  Consultations:  Urology   Procedures/Studies: Ct Abdomen Pelvis W Contrast  Result Date: 09/21/2018 CLINICAL DATA:  Right-sided abdominal pain.  Fever. EXAM: CT ABDOMEN AND PELVIS WITH CONTRAST TECHNIQUE: Multidetector CT imaging of the  abdomen and pelvis was performed using the standard protocol following bolus administration of intravenous contrast. CONTRAST:  143mL OMNIPAQUE IOHEXOL 300 MG/ML  SOLN COMPARISON:  CT scan of September 17, 2018. FINDINGS: Lower chest: Mild right posterior basilar subsegmental atelectasis is noted. Hepatobiliary: Hepatic steatosis is noted. No gallstones or biliary dilatation is noted. Pancreas: Unremarkable. No pancreatic ductal dilatation or surrounding inflammatory changes. Spleen: Normal in size without focal abnormality. Adrenals/Urinary Tract: Adrenal glands appear normal. Left kidney and ureter are unremarkable. Right ureteral stent is noted in grossly good position with proximal portion in right renal pelvis and distal portion within urinary bladder. No hydronephrosis is noted. However, there is enlargement of the right kidney compared to prior exam with some hypoechoic areas concerning for pyelonephritis. Stomach/Bowel: The stomach appears normal. There is no evidence of bowel obstruction or inflammation. Status post appendectomy. Vascular/Lymphatic: No significant vascular findings are present. No enlarged abdominal or pelvic lymph nodes. Reproductive: Intrauterine device is in grossly good position. No adnexal abnormality is noted. Other: Mild amount of free fluid is noted in the pelvis of unknown etiology. No significant hernia is noted. Musculoskeletal: No acute or significant osseous findings. IMPRESSION: Right-sided ureteral stent is noted in grossly good position. No hydronephrosis is noted. However, there is enlargement of the right  kidney with hypoechoic areas concerning for pyelonephritis. Hepatic steatosis. Mild amount of free fluid is noted in the pelvis of unknown etiology. Mild right posterior basilar subsegmental atelectasis is noted. Electronically Signed   By: Marijo Conception, M.D.   On: 09/21/2018 16:50   Dg C-arm 1-60 Min-no Report  Result Date: 09/18/2018 Fluoroscopy was utilized by the  requesting physician.  No radiographic interpretation.   Ct Renal Stone Study  Result Date: 09/17/2018 CLINICAL DATA:  Right flank pain since yesterday, urinary urgency, dysuria, no hx renal stones EXAM: CT ABDOMEN AND PELVIS WITHOUT CONTRAST TECHNIQUE: Multidetector CT imaging of the abdomen and pelvis was performed following the standard protocol without IV contrast. COMPARISON:  None. FINDINGS: Lower chest: Clear lung bases.  Heart normal in size. Hepatobiliary: Diffuse decreased attenuation of the liver consistent with fatty infiltration. Liver mildly enlarged. No liver mass or focal lesion. Gallbladder is unremarkable. No bile duct dilation Pancreas: Unremarkable. No pancreatic ductal dilatation or surrounding inflammatory changes. Spleen: Normal in size without focal abnormality. Adrenals/Urinary Tract: No adrenal masses. There is mild right hydronephrosis with more significant right perinephric stranding. Stranding extends to the proximal right ureter. Findings are due to a 3 mm stone at the right ureterovesicular junction. No left hydronephrosis or perinephric stranding. No intrarenal stones. No renal masses. Left ureter is normal course and in caliber. Bladder is unremarkable. Stomach/Bowel: Stomach is unremarkable. Small bowel and colon are normal in caliber. No wall thickening. No inflammation. Vascular/Lymphatic: No significant vascular findings are present. No enlarged abdominal or pelvic lymph nodes. Reproductive: Well-positioned IUD. Uterus and adnexa otherwise unremarkable. Other: No abdominal wall hernia or abnormality. No abdominopelvic ascites. Musculoskeletal: No acute or significant osseous findings. IMPRESSION: 1. 3 mm stone at the right ureterovesicular junction leads to mild right hydroureteronephrosis with prominent right perinephric stranding. 2. No other acute abnormalities.  No intrarenal stones. 3. Mild hepatomegaly with diffuse hepatic steatosis. Electronically Signed   By: Lajean Manes M.D.   On: 09/17/2018 17:59      Subjective: No complaints, feels better.   Discharge Exam: Vitals:   09/22/18 0230 09/22/18 0654  BP: (!) 146/89 (!) 155/93  Pulse: 81 90  Resp: (!) 24 20  Temp: 100.2 F (37.9 C) (!) 100.5 F (38.1 C)  SpO2: 98% 97%   Vitals:   09/21/18 2126 09/21/18 2325 09/22/18 0230 09/22/18 0654  BP: (!) 164/94 (!) 146/88 (!) 146/89 (!) 155/93  Pulse: 90 92 81 90  Resp: 20 20 (!) 24 20  Temp: (!) 102.2 F (39 C) 100 F (37.8 C) 100.2 F (37.9 C) (!) 100.5 F (38.1 C)  TempSrc: Oral Oral Oral Oral  SpO2: 97% 95% 98% 97%  Weight:      Height:        General: Pt is alert, awake, not in acute distress Cardiovascular: RRR, S1/S2 +, no rubs, no gallops Respiratory: CTA bilaterally, no wheezing, no rhonchi Abdominal: Soft, NT, ND, bowel sounds + Extremities: no edema, no cyanosis    The results of significant diagnostics from this hospitalization (including imaging, microbiology, ancillary and laboratory) are listed below for reference.     Microbiology: Recent Results (from the past 240 hour(s))  Blood culture (routine x 2)     Status: None (Preliminary result)   Collection Time: 09/17/18  8:59 PM  Result Value Ref Range Status   Specimen Description   Final    BLOOD LEFT HAND Performed at [facility], [address] [place], [place]  [address]    Special Requests   Final    BOTTLES DRAWN AEROBIC AND ANAEROBIC Blood Culture adequate volume Performed at [facility], [address] [place], [place] [address]    Culture   Final    NO GROWTH 4 DAYS Performed at [facility], [place] [address] [place], [place] [address]    Report Status PENDING  Incomplete  Blood culture (routine x 2)     Status: None (Preliminary result)   Collection Time: 09/17/18 10:30 PM  Result Value Ref Range Status   Specimen Description   Final    BLOOD LEFT ANTECUBITAL Performed at [facility], [address]  [place], [place] [address]    Special Requests   Final    BOTTLES DRAWN AEROBIC ONLY Blood Culture adequate volume Performed at [facility], [address] [place], [place] [address]    Culture   Final    NO GROWTH 4 DAYS Performed at [facility], [place] [address], [place], [place] [address]    Report Status PENDING  Incomplete  Urine culture     Status: Abnormal   Collection Time: 09/17/18 10:35 PM  Result Value Ref Range Status   Specimen Description URINE, RANDOM  Final   Special Requests   Final    NONE Performed at [facility], [address] [place], [place] [address]    Culture <10,000 COLONIES/mL INSIGNIFICANT GROWTH (A)  Final   Report Status 09/19/2018 FINAL  Final  Surgical pcr screen     Status: None   Collection Time: 09/18/18 11:12 AM  Result Value Ref Range Status   MRSA, PCR NEGATIVE NEGATIVE Final   Staphylococcus aureus NEGATIVE NEGATIVE Final    Comment: (NOTE) The Xpert SA Assay (FDA approved for NASAL specimens in patients 10 years of age and older), is one component of a comprehensive surveillance program. It is not intended to diagnose infection nor to guide or monitor treatment. Performed at [facility], [place] [address], [place], [place] [address]   Culture, blood (Routine X 2) w Reflex to ID Panel     Status: None (Preliminary result)   Collection Time: 09/20/18  5:02 PM  Result Value Ref Range Status   Specimen Description   Final    BLOOD RIGHT HAND Performed at [place] [address], [place], [place] [address]    Special Requests   Final    BOTTLES DRAWN AEROBIC AND ANAEROBIC Blood Culture adequate volume Performed at [place] [address] [place], [place] [address]    Culture   Final    NO GROWTH 2 DAYS Performed at [facility] [address], [place], [place] [address]    Report Status PENDING  Incomplete  Culture, blood (Routine X 2)  w Reflex to ID Panel     Status: None (Preliminary result)   Collection Time: 09/20/18  5:04 PM  Result Value Ref Range Status   Specimen Description   Final    BLOOD LEFT HAND Performed at [place] [address], [place], [place] [address]    Special Requests   Final    BOTTLES DRAWN AEROBIC ONLY Blood Culture adequate volume Performed at [facility] [address], [place], [place] [address]    Culture   Final    NO GROWTH 2 DAYS Performed at [facility],  [address], [place], [place] [address]    Report Status PENDING  Incomplete     Labs: BNP (last 3 results) No results for input(s): BNP in the last 8760 hours. Basic Metabolic Panel: Recent Labs  Lab 09/17/18 1721  09/18/18 1232 09/18/18 1302 09/19/18 0506 09/20/18 0337 09/21/18 0335 09/22/18 0335  NA 129*   < > 136 140 136 137 137 136  K 2.9*   < > 3.2* 3.7 3.7 3.4* 3.1* 3.2*  CL 95*   < > 106  --  109 109 110 108  CO2 20*   < > 20*  --  18* 19* 19* 19*  GLUCOSE 93   < > 114* 113* 161* 117* 102* 97  BUN 22*   < > 23*  --  19 17 14 10   CREATININE 2.28*   < > 2.02*  --  1.68* 1.37* 1.21* 0.96  CALCIUM 8.5*   < > 7.3*  --  7.3* 7.7* 7.7* 8.0*  MG 1.4*  --   --   --  2.1 2.3 2.1 1.8   < > = values in this interval not displayed.   Liver Function Tests: Recent Labs  Lab 09/17/18 1721  AST 86*  ALT 49*  ALKPHOS 62  BILITOT 0.9  PROT 8.2*  ALBUMIN 3.8   No results for input(s): LIPASE, AMYLASE in the last 168 hours. No results for input(s): AMMONIA in the last 168 hours. CBC: Recent Labs  Lab 09/17/18 1721 09/18/18 0501 09/18/18 1302 09/19/18 0506 09/20/18 0337 09/21/18 0335 09/22/18 0335  WBC 16.8* 15.5*  --  15.8* 16.3* 13.8* 12.4*  NEUTROABS 15.9* 14.1*  --  13.3* 13.8* 9.6*  --   HGB 13.7 13.3 12.9 11.7* 12.0 10.4* 10.9*  HCT 43.1 42.3 38.0 37.5 37.8 33.3* 34.5*  MCV 84.7 86.0  --  89.1 85.5 87.4 85.2  PLT 173 115*  --  116* 161 165 207    Cardiac Enzymes: No results for input(s): CKTOTAL, CKMB, CKMBINDEX, TROPONINI in the last 168 hours. BNP: Invalid input(s): POCBNP CBG: No results for input(s): GLUCAP in the last 168 hours. D-Dimer No results for input(s): DDIMER in the last 72 hours. Hgb A1c No results for input(s): HGBA1C in the last 72 hours. Lipid Profile No results for input(s): CHOL, HDL, LDLCALC, TRIG, CHOLHDL, LDLDIRECT in the last 72 hours. Thyroid function studies No results for input(s): TSH, T4TOTAL, T3FREE, THYROIDAB in the last 72 hours.  Invalid input(s): FREET3 Anemia work up No results for input(s): VITAMINB12, FOLATE, FERRITIN, TIBC, IRON, RETICCTPCT in the last 72 hours. Urinalysis    Component Value Date/Time   COLORURINE YELLOW 09/17/2018 1618   APPEARANCEUR CLEAR 09/17/2018 1618   LABSPEC >1.030 (H) 09/17/2018 1618   PHURINE 5.5 09/17/2018 1618   GLUCOSEU NEGATIVE 09/17/2018 1618   GLUCOSEU NEGATIVE 09/01/2017 0943   HGBUR LARGE (A) 09/17/2018 1618   BILIRUBINUR SMALL (A) 09/17/2018 1618   KETONESUR NEGATIVE 09/17/2018 1618   PROTEINUR 100 (A) 09/17/2018 1618   UROBILINOGEN 0.2 09/01/2017 0943   NITRITE NEGATIVE 09/17/2018 1618   LEUKOCYTESUR NEGATIVE 09/17/2018 1618   Sepsis Labs Invalid input(s): PROCALCITONIN,  WBC,  LACTICIDVEN Microbiology Recent Results (from the past 240 hour(s))  Blood culture (routine x 2)     Status: None (Preliminary result)   Collection Time: 09/17/18  8:59 PM  Result Value Ref Range Status   Specimen Description   Final    BLOOD LEFT HAND Performed at [facility] [address] [place], [place] [address]    Special Requests   Final    BOTTLES DRAWN AEROBIC AND ANAEROBIC Blood Culture adequate volume Performed at [facility], [address] [place], [place] [address]    Culture   Final    NO GROWTH 4 DAYS Performed at [facility], [place] [address], [place], [place] [address]    Report Status PENDING   Incomplete  Blood culture (routine x 2)     Status: None (Preliminary result)   Collection Time: 09/17/18 10:30 PM  Result Value Ref Range Status   Specimen Description   Final    BLOOD LEFT ANTECUBITAL Performed at [facility], [address] [place], [place] [address]    Special Requests   Final    BOTTLES DRAWN AEROBIC ONLY Blood Culture adequate volume Performed at [facility], [address] [place], [place] [address]    Culture   Final    NO GROWTH 4 DAYS Performed at [facility], [place] [address], [place], [place] [address]    Report Status PENDING  Incomplete  Urine culture     Status: Abnormal   Collection Time: 09/17/18 10:35 PM  Result Value Ref Range Status   Specimen Description URINE, RANDOM  Final   Special Requests   Final    NONE Performed at [facility], [address] [place], [place] [address]    Culture <10,000 COLONIES/mL INSIGNIFICANT GROWTH (A)  Final   Report Status 09/19/2018 FINAL  Final  Surgical pcr screen     Status: None   Collection Time: 09/18/18 11:12 AM  Result Value Ref Range Status   MRSA, PCR NEGATIVE NEGATIVE Final   Staphylococcus aureus NEGATIVE NEGATIVE Final    Comment: (NOTE) The Xpert SA Assay (FDA approved for NASAL specimens in patients 55 years of age and older), is one component of a comprehensive surveillance program. It is not intended to diagnose infection nor to guide or monitor treatment. Performed at [facility], [place] [address] [place], [place] [address]   Culture, blood (Routine X 2) w Reflex to ID Panel     Status: None (Preliminary result)   Collection Time: 09/20/18  5:02 PM  Result Value Ref Range Status   Specimen Description   Final    BLOOD RIGHT HAND Performed at [place] [address] [place], [place] [address]    Special Requests   Final    BOTTLES DRAWN AEROBIC AND ANAEROBIC Blood Culture adequate  volume Performed at [facility] [address] [place], [place] [address]    Culture   Final    NO GROWTH 2 DAYS Performed at [facility] [address] [place], [place] [address]    Report Status PENDING  Incomplete  Culture, blood (Routine X 2) w Reflex to ID Panel     Status: None (Preliminary result)   Collection Time: 09/20/18  5:04 PM  Result Value Ref Range Status   Specimen Description   Final    BLOOD LEFT HAND Performed at [facility] [address], [place], [place] [address]    Special Requests   Final    BOTTLES DRAWN AEROBIC ONLY Blood Culture adequate volume Performed at [facility] [address] [place], [place] [address]    Culture   Final    NO GROWTH 2 DAYS Performed  at [facility], [place] [address] [place], [place] [address]    Report Status PENDING  Incomplete     Time coordinating discharge:  I have spent 35 minutes face to face with the patient and on the ward discussing the patients care, assessment, plan and disposition with other care givers. >50% of the time was devoted counseling the patient about the risks and benefits of treatment/Discharge disposition and coordinating care.   SIGNED:   Damita Lack, MD  [facility] 09/22/2018, 11:33 AM   If 7PM-7AM, please contact night-coverage [email or web address]

## 2018-09-22 NOTE — Progress Notes (Signed)
Pt discharged home with aunt in stable condition. Discharge instructions given. Scripts sent to pharmacy of choice. No immediate questions or concerns at this time. Discharged from unit via wheelchair.

## 2018-09-23 ENCOUNTER — Telehealth: Payer: Self-pay | Admitting: *Deleted

## 2018-09-23 LAB — CULTURE, BLOOD (ROUTINE X 2)
CULTURE: NO GROWTH
Culture: NO GROWTH
SPECIAL REQUESTS: ADEQUATE
Special Requests: ADEQUATE

## 2018-09-23 NOTE — Telephone Encounter (Signed)
Transition Care Management Follow-up Telephone Call   Date discharged? 09/22/2018   How have you been since you were released from the hospital? "I'm getting better"   Do you understand why you were in the hospital? yes   Do you understand the discharge instructions? yes   Where were you discharged to? Home with parents   Items Reviewed:  Medications reviewed: yes  Allergies reviewed: yes  Dietary changes reviewed: yes  Referrals reviewed: yes   Functional Questionnaire:   Activities of Daily Living (ADLs):   She states they are independent in the following: ambulation, bathing and hygiene, feeding, continence, grooming, toileting and dressing States they require assistance with the following: n/a   Any transportation issues/concerns?: no   Any patient concerns? no   Confirmed importance and date/time of follow-up visits scheduled yes  Provider Appointment booked with PCP 09/27/18 @1140   Confirmed with patient if condition begins to worsen call PCP or go to the ER.  Patient was given the office number and encouraged to call back with question or concerns.  : yes

## 2018-09-25 LAB — CULTURE, BLOOD (ROUTINE X 2)
Culture: NO GROWTH
Culture: NO GROWTH
Special Requests: ADEQUATE
Special Requests: ADEQUATE

## 2018-09-27 ENCOUNTER — Ambulatory Visit (INDEPENDENT_AMBULATORY_CARE_PROVIDER_SITE_OTHER): Payer: BLUE CROSS/BLUE SHIELD | Admitting: Family

## 2018-09-27 ENCOUNTER — Encounter: Payer: Self-pay | Admitting: Family

## 2018-09-27 VITALS — BP 115/79 | HR 94 | Temp 98.5°F | Resp 16 | Ht 64.0 in | Wt 236.0 lb

## 2018-09-27 DIAGNOSIS — I10 Essential (primary) hypertension: Secondary | ICD-10-CM

## 2018-09-27 DIAGNOSIS — N12 Tubulo-interstitial nephritis, not specified as acute or chronic: Secondary | ICD-10-CM

## 2018-09-27 DIAGNOSIS — J111 Influenza due to unidentified influenza virus with other respiratory manifestations: Secondary | ICD-10-CM

## 2018-09-27 NOTE — Patient Instructions (Addendum)
Please complete lab work prior to leaving. Follow up in 3 months.  

## 2018-09-27 NOTE — Progress Notes (Signed)
Subjective:    Patient ID: Julia Morris, female    DOB: June 07, 1976, 43 y.o.   MRN: [id number]  HPI   Patient is a 43 yr old female who presents today for hospital follow up.  Discharge summary is reviewed.  The patient presented to the ED on September 17, 2018 with complaint of right-sided flank pain and difficulty voiding.  She was diagnosed with sepsis secondary to right-sided pyelonephritis.  Imaging revealed right-sided hydronephrosis with a 3 mm obstructing stone.  She underwent stent placement on September 18, 2018.  Postop she was started on Rocephin but had some fever and was switched over to cefepime.  While in the hospital she was also diagnosed with flu A and received a 5-day course of Tamiflu.  CT performed the day prior to discharge noted right sided pyelonephritis.  Blood cultures performed during her admission were all negative. Urine cultures did not show any significant growth.  She was discharged home with Cipro 500 mg twice daily.  She was instructed to schedule a follow-up with Dr. Franchot Gallo of urology.  She reports that she returned home on Thursday. Did not feel well Thursday, Friday.  Started to feel better on Saturday. Returned to work on Monday. Sees urology tomorrow for stent removal. Denies current dysuria, back pain or hematuria.    She reports resolution of flu symptoms.    Review of Systems See HPI  Past Medical History:  Diagnosis Date  . Anemia    ? history of anemia  . Hypertension      Social History   Socioeconomic History  . Marital status: Single    Spouse name: Not on file  . Number of children: 1  . Years of education: Not on file  . Highest education level: Not on file  Occupational History    Employer: [employer]  Social Needs  . Financial resource strain: Not on file  . Food insecurity:    Worry: Not on file    Inability: Not on file  . Transportation needs:    Medical: Not on file    Non-medical: Not on file    Tobacco Use  . Smoking status: Never Smoker  . Smokeless tobacco: Never Used  Substance and Sexual Activity  . Alcohol use: Yes    Comment: occasional  . Drug use: No  . Sexual activity: Never    Partners: Male  Lifestyle  . Physical activity:    Days per week: Not on file    Minutes per session: Not on file  . Stress: Not on file  Relationships  . Social connections:    Talks on phone: Not on file    Gets together: Not on file    Attends religious service: Not on file    Active member of club or organization: Not on file    Attends meetings of clubs or organizations: Not on file    Relationship status: Not on file  . Intimate partner violence:    Fear of current or ex partner: Not on file    Emotionally abused: Not on file    Physically abused: Not on file    Forced sexual activity: Not on file  Other Topics Concern  . Not on file  Social History Narrative   Works as a Manufacturing engineer   Working on [employer]- in [employer], working in [place]   1 child- daughter born in 1999   She lives with her parents and  daughter, in process of getting ready to move out.   Enjoys running/exercise.    Past Surgical History:  Procedure Laterality Date  . APPENDECTOMY  2003  . CYSTOSCOPY WITH RETROGRADE PYELOGRAM, URETEROSCOPY AND STENT PLACEMENT N/A 09/18/2018   Procedure: CYSTOSCOPY WITH RIGHT RETROGRADE PYELOGRAM,  AND RIGHT JJ STENT PLACEMENT;  Surgeon: Franchot Gallo, MD;  Location: [facility];  Service: Urology;  Laterality: N/A;  . LEG SURGERY     femoral rod placement s/p fracture 2005 left leg  . TOE SURGERY  2006   right great toe.   . TONSILLECTOMY  1980s    Family History  Problem Relation Age of Onset  . Hypertension Mother   . Hyperlipidemia Mother   . Stroke Mother   . Hypertension Father   . Hyperlipidemia Father   . Diabetes Maternal Aunt   . Hypertension Maternal Aunt   . Hyperlipidemia Maternal Aunt   . Diabetes Maternal Uncle   . Hypertension  Maternal Uncle   . Hyperlipidemia Maternal Uncle   . Diabetes Paternal Aunt   . Hypertension Paternal Aunt   . Hyperlipidemia Paternal Aunt   . Diabetes Paternal Uncle   . Hypertension Paternal Uncle   . Hyperlipidemia Paternal Uncle   . Diabetes Maternal Grandmother   . Hypertension Maternal Grandmother   . Hyperlipidemia Maternal Grandmother   . Diabetes Maternal Grandfather   . Hypertension Maternal Grandfather   . Hyperlipidemia Maternal Grandfather   . Diabetes Paternal Grandmother   . Hypertension Paternal Grandmother   . Hyperlipidemia Paternal Grandmother   . Heart attack Neg Hx   . Sudden death Neg Hx     Allergies  Allergen Reactions  . Prednisone Swelling    Current Outpatient Medications on File Prior to Visit  Medication Sig Dispense Refill  . acetaminophen (TYLENOL) 500 MG tablet Take 1,000 mg by mouth every 6 (six) hours as needed for mild pain.    . benzonatate (TESSALON) 100 MG capsule Take 1 capsule (100 mg total) by mouth 3 (three) times daily as needed. 30 capsule 0  . ciprofloxacin (CIPRO) 500 MG tablet Take 1 tablet (500 mg total) by mouth 2 (two) times daily for 7 days. 14 tablet 0  . hydrochlorothiazide (HYDRODIURIL) 25 MG tablet Take 1 tablet (25 mg total) by mouth daily. 90 tablet 1  . levonorgestrel (MIRENA) 20 MCG/24HR IUD 1 each by Intrauterine route once.    Marland Kitchen oxybutynin (DITROPAN) 5 MG tablet Take 1 tablet (5 mg total) by mouth 3 (three) times daily for 30 days. 90 tablet 0  . oxyCODONE-acetaminophen (PERCOCET/ROXICET) 5-325 MG tablet Take 1 tablet by mouth every 4 (four) hours as needed for up to 7 days for severe pain. 20 tablet 0  . polyethylene glycol (MIRALAX / GLYCOLAX) packet Take 17 g by mouth daily as needed for moderate constipation or severe constipation. 24 each 0  . senna-docusate (SENOKOT-S) 8.6-50 MG tablet Take 2 tablets by mouth at bedtime as needed for mild constipation or moderate constipation (use first). 30 tablet 0   No  current facility-administered medications on file prior to visit.     BP 115/79 (BP Location: Right Arm, Patient Position: Sitting, Cuff Size: Large)   Pulse 94   Temp 98.5 F (36.9 C) (Oral)   Resp 16   Ht 5\' 4"  (1.626 m)   Wt 236 lb (107 kg)   SpO2 99%   BMI 40.51 kg/m       Objective:   Physical Exam Constitutional:  Appearance: She is well-developed.  Neck:     Musculoskeletal: Neck supple.     Thyroid: No thyromegaly.  Cardiovascular:     Rate and Rhythm: Normal rate and regular rhythm.     Heart sounds: Normal heart sounds. No murmur.  Pulmonary:     Effort: Pulmonary effort is normal. No respiratory distress.     Breath sounds: Normal breath sounds. No wheezing.  Abdominal:     Palpations: Abdomen is soft.     Tenderness: There is no abdominal tenderness. There is no right CVA tenderness or left CVA tenderness.  Skin:    General: Skin is warm and dry.  Neurological:     Mental Status: She is alert and oriented to person, place, and time.  Psychiatric:        Behavior: Behavior normal.        Thought Content: Thought content normal.        Judgment: Judgment normal.           Assessment & Plan:  Left sided pyelonephritis- clinically resolved. Complete cipro, keep follow up appointment with urology tomorrow for stent removal. Obtain follow up bmet and cbc.  Influenza A- clinically resolved.  HTN- BP is stable. Continue hctz,  BP Readings from Last 3 Encounters:  09/27/18 115/79  09/22/18 (!) 155/93  09/16/18 (!) 141/82

## 2018-09-28 ENCOUNTER — Other Ambulatory Visit (INDEPENDENT_AMBULATORY_CARE_PROVIDER_SITE_OTHER): Payer: BLUE CROSS/BLUE SHIELD

## 2018-09-28 DIAGNOSIS — N12 Tubulo-interstitial nephritis, not specified as acute or chronic: Secondary | ICD-10-CM

## 2018-09-28 DIAGNOSIS — R8271 Bacteriuria: Secondary | ICD-10-CM | POA: Diagnosis not present

## 2018-09-28 DIAGNOSIS — N200 Calculus of kidney: Secondary | ICD-10-CM | POA: Diagnosis not present

## 2018-09-28 NOTE — Addendum Note (Signed)
Addended by: Kelle Darting A on: 09/28/2018 03:22 PM   Modules accepted: Orders

## 2018-09-29 LAB — BASIC METABOLIC PANEL
BUN: 15 mg/dL (ref 6–23)
CHLORIDE: 100 meq/L (ref 96–112)
CO2: 28 mEq/L (ref 19–32)
Calcium: 9.2 mg/dL (ref 8.4–10.5)
Creatinine, Ser: 0.97 mg/dL (ref 0.40–1.20)
GFR: 76.22 mL/min (ref >60.00)
Glucose, Bld: 90 mg/dL (ref 70–99)
POTASSIUM: 4.1 meq/L (ref 3.5–5.1)
Sodium: 140 mEq/L (ref 135–145)

## 2018-09-29 LAB — CBC WITH DIFFERENTIAL/PLATELET
Basophils Absolute: 0 10*3/uL (ref 0.0–0.1)
Basophils Relative: 0.4 % (ref 0.0–3.0)
EOS PCT: 2 % (ref 0.0–5.0)
Eosinophils Absolute: 0.1 10*3/uL (ref 0.0–0.7)
HEMATOCRIT: 36.3 % (ref 36.0–46.0)
Hemoglobin: 12 g/dL (ref 12.0–15.0)
Lymphocytes Relative: 39.8 % (ref 12.0–46.0)
Lymphs Abs: 2.2 10*3/uL (ref 0.7–4.0)
MCHC: 33 g/dL (ref 30.0–36.0)
MCV: 83.9 fl (ref 78.0–100.0)
Monocytes Absolute: 0.7 10*3/uL (ref 0.1–1.0)
Monocytes Relative: 12.4 % — ABNORMAL HIGH (ref 3.0–12.0)
Neutro Abs: 2.5 10*3/uL (ref 1.4–7.7)
Neutrophils Relative %: 45.4 % (ref 43.0–77.0)
Platelets: 567 10*3/uL — ABNORMAL HIGH (ref 150.0–400.0)
RBC: 4.32 Mil/uL (ref 3.87–5.11)
RDW: 13.9 % (ref 11.5–15.5)
WBC: 5.5 10*3/uL (ref 4.0–10.5)

## 2018-09-30 ENCOUNTER — Other Ambulatory Visit: Payer: Self-pay

## 2018-09-30 DIAGNOSIS — D473 Essential (hemorrhagic) thrombocythemia: Secondary | ICD-10-CM

## 2018-09-30 DIAGNOSIS — D75839 Thrombocytosis, unspecified: Secondary | ICD-10-CM

## 2018-09-30 NOTE — Progress Notes (Signed)
cb

## 2018-10-11 ENCOUNTER — Other Ambulatory Visit: Payer: Self-pay | Admitting: Family

## 2020-04-22 IMAGING — CT CT ABD-PELV W/ CM
2 of 5 series · 16 of 46 positions shown, 18 images · IV contrast (APPLIED)
Comparison: CT scan of September 17, 2018.

CLINICAL DATA: Right-sided abdominal pain.  Fever.

EXAM:
CT ABDOMEN AND PELVIS WITH CONTRAST
TECHNIQUE: Multidetector CT imaging of the abdomen and pelvis was performed
using the standard protocol following bolus administration of
intravenous contrast.
CONTRAST:  100mL OMNIPAQUE IOHEXOL 300 MG/ML  SOLN

[Series 2: axial st · axial · 0.85mm/px · z∈[-814,-419]mm · 13 of 93 slices shown, 15 images]
[im 7/93  soft-tissue]
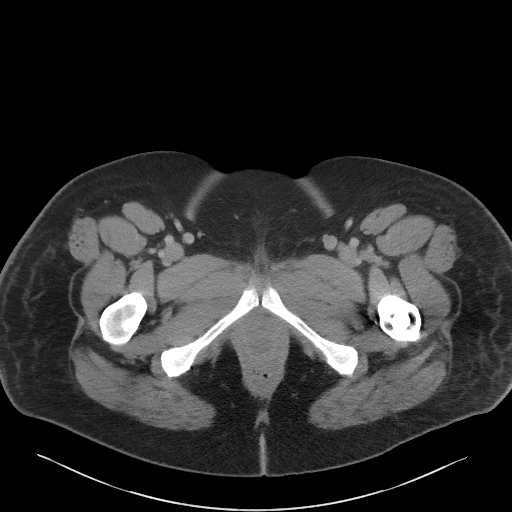
[im 7/93  bone]
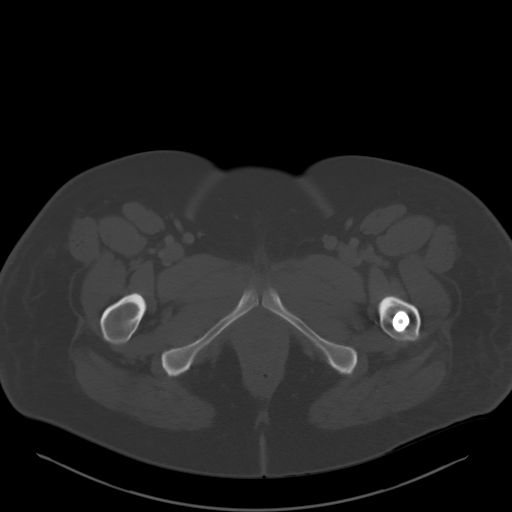
[im 14/93  soft-tissue]
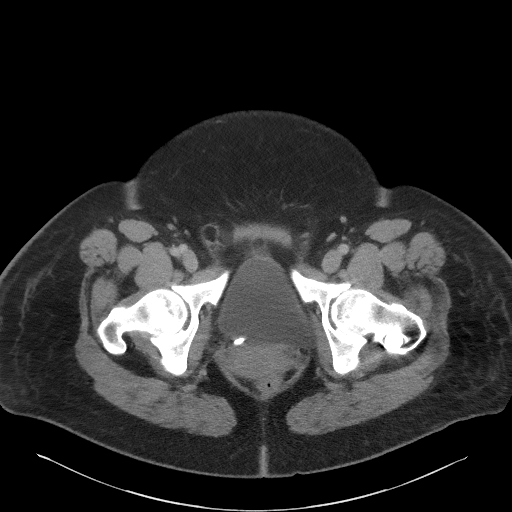
[im 20/93  soft-tissue]
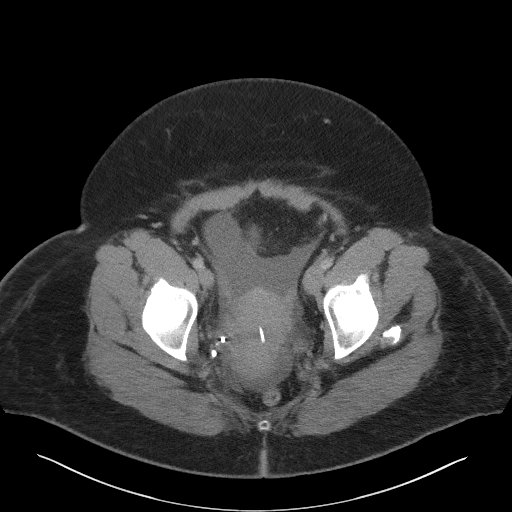
[im 27/93  soft-tissue]
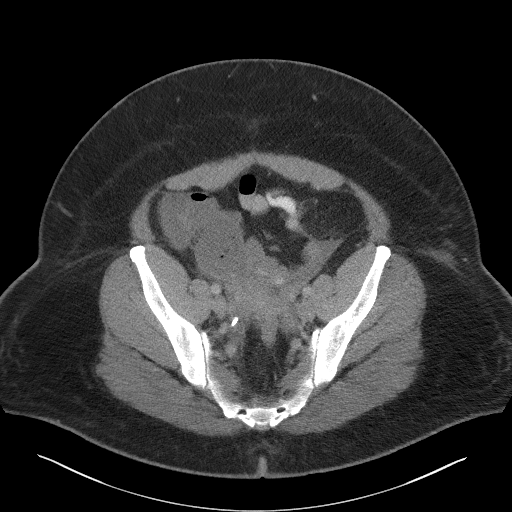
[im 33/93  soft-tissue]
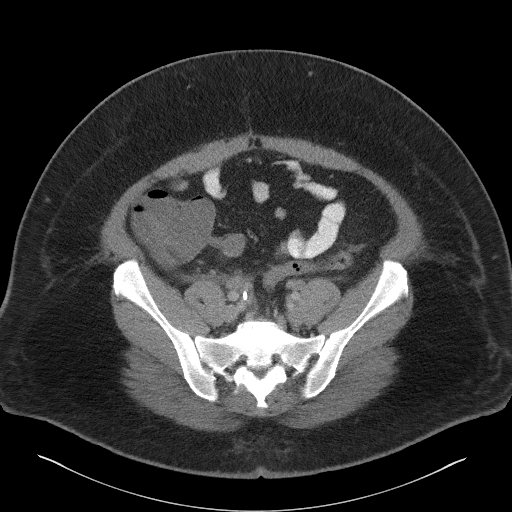
[im 40/93  soft-tissue]
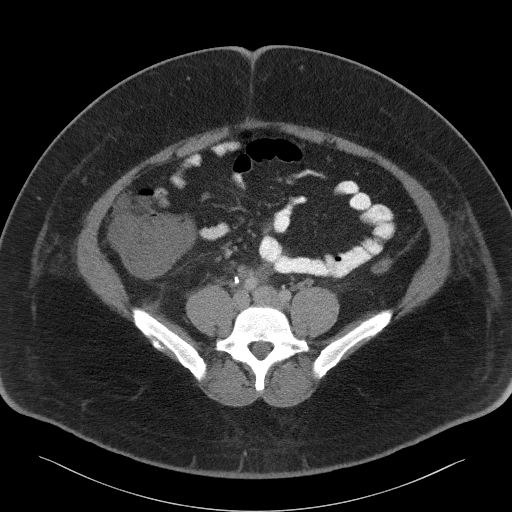
[im 47/93  soft-tissue]
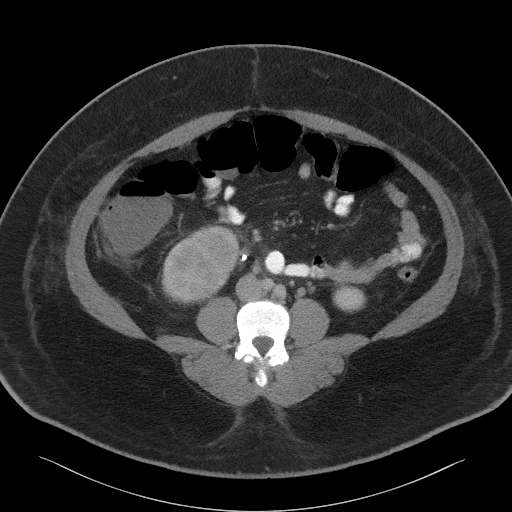
[im 53/93  soft-tissue]
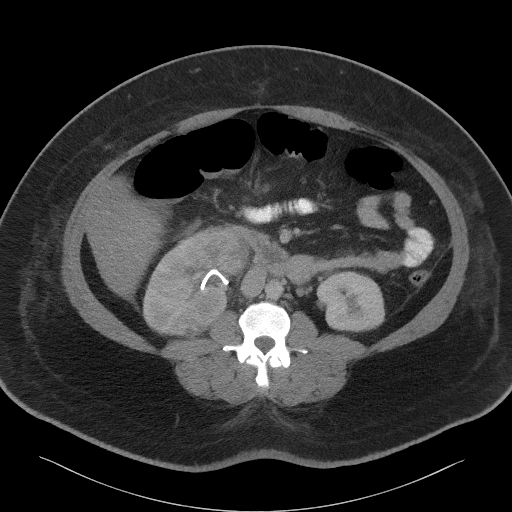
[im 60/93  soft-tissue]
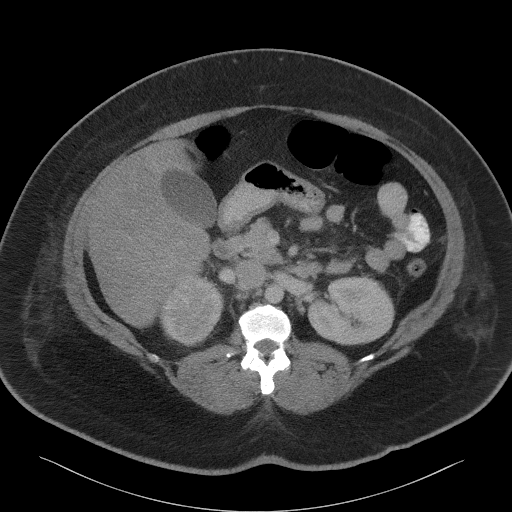
[im 60/93  bone]
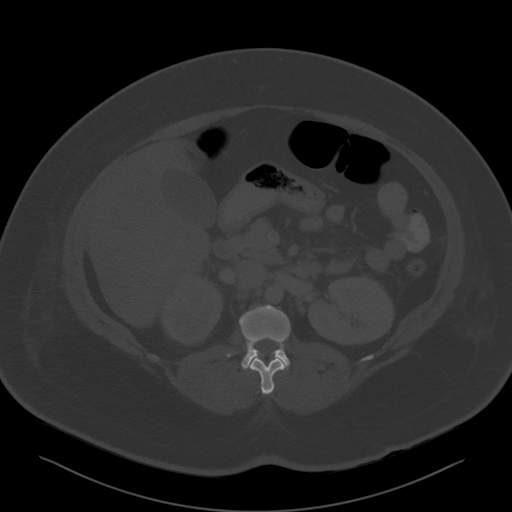
[im 66/93  soft-tissue]
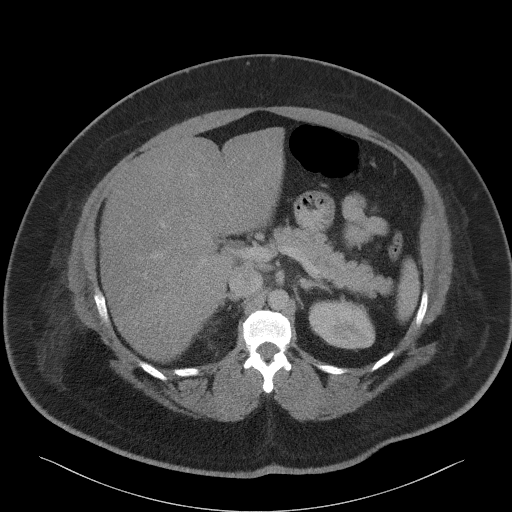
[im 73/93  soft-tissue]
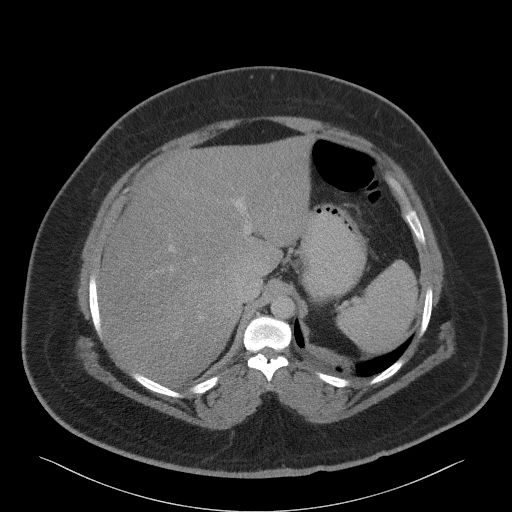
[im 79/93  soft-tissue]
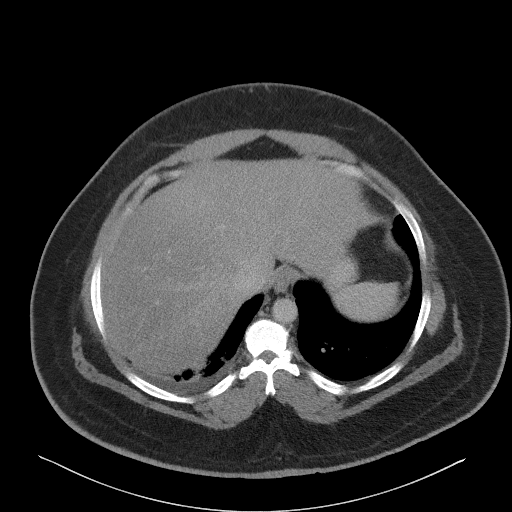
[im 86/93  soft-tissue]
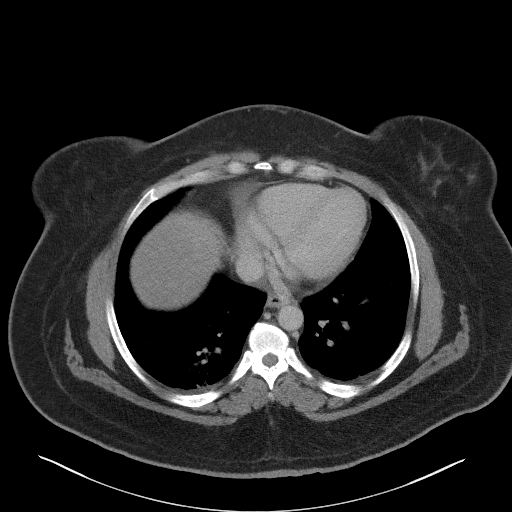

[Series 5: coronal st · coronal · 0.83mm/px · 3 of 115 slices shown]
[im 39/115  soft-tissue]
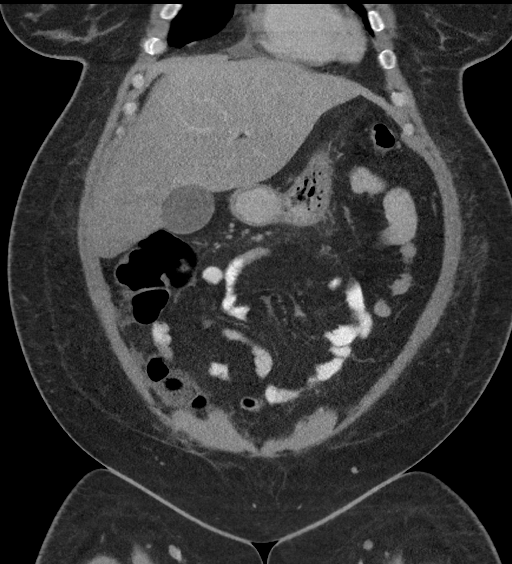
[im 51/115  soft-tissue]
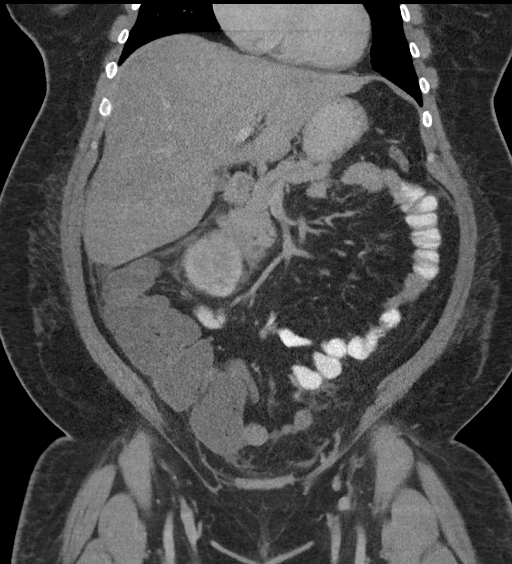
[im 64/115  soft-tissue]
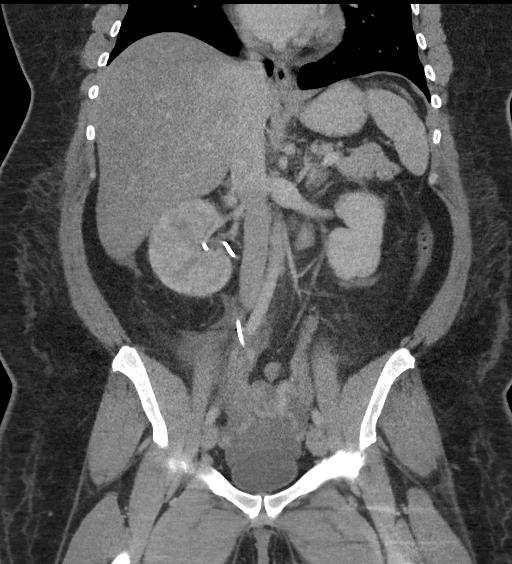

[16 of 46 positions shown; findings below may reference images not displayed]

FINDINGS: Lower chest: Mild right posterior basilar subsegmental atelectasis
is noted.

Hepatobiliary: Hepatic steatosis is noted. No gallstones or biliary
dilatation is noted.

Pancreas: Unremarkable. No pancreatic ductal dilatation or
surrounding inflammatory changes.

Spleen: Normal in size without focal abnormality.

Adrenals/Urinary Tract: Adrenal glands appear normal. Left kidney
and ureter are unremarkable. Right ureteral stent is noted in
grossly good position with proximal portion in right renal pelvis
and distal portion within urinary bladder. No hydronephrosis is
noted. However, there is enlargement of the right kidney compared to
prior exam with some hypoechoic areas concerning for pyelonephritis.

Stomach/Bowel: The stomach appears normal. There is no evidence of
bowel obstruction or inflammation. Status post appendectomy.

Vascular/Lymphatic: No significant vascular findings are present. No
enlarged abdominal or pelvic lymph nodes.

Reproductive: Intrauterine device is in grossly good position. No
adnexal abnormality is noted.

Other: Mild amount of free fluid is noted in the pelvis of unknown
etiology. No significant hernia is noted.

Musculoskeletal: No acute or significant osseous findings.
IMPRESSION: Right-sided ureteral stent is noted in grossly good position. No
hydronephrosis is noted. However, there is enlargement of the right
kidney with hypoechoic areas concerning for pyelonephritis.

Hepatic steatosis.

Mild amount of free fluid is noted in the pelvis of unknown
etiology.

Mild right posterior basilar subsegmental atelectasis is noted.
